# Patient Record
Sex: Female | Born: 1955 | Race: White | Hispanic: No | Marital: Single | Smoking: Never smoker
Health system: Southern US, Community
[De-identification: ages and names within clinical notes are randomized; demographics above are authoritative.]

## PROBLEM LIST (undated history)

## (undated) DIAGNOSIS — T4145XA Adverse effect of unspecified anesthetic, initial encounter: Secondary | ICD-10-CM

## (undated) DIAGNOSIS — R112 Nausea with vomiting, unspecified: Secondary | ICD-10-CM

## (undated) DIAGNOSIS — E039 Hypothyroidism, unspecified: Secondary | ICD-10-CM

## (undated) DIAGNOSIS — Z9889 Other specified postprocedural states: Secondary | ICD-10-CM

## (undated) DIAGNOSIS — T8859XA Other complications of anesthesia, initial encounter: Secondary | ICD-10-CM

## (undated) HISTORY — PX: CERVICAL CONE BIOPSY: SUR198

## (undated) HISTORY — PX: DILATION AND CURETTAGE OF UTERUS: SHX78

## (undated) HISTORY — PX: TONSILLECTOMY AND ADENOIDECTOMY: SUR1326

## (undated) HISTORY — PX: LASIK: SHX215

## (undated) HISTORY — PX: CARPAL TUNNEL RELEASE: SHX101

## (undated) HISTORY — PX: CHOLECYSTECTOMY: SHX55

---
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
## 2001-06-05 ENCOUNTER — Other Ambulatory Visit: Admission: RE | Admit: 2001-06-05 | Discharge: 2001-06-05 | Payer: Self-pay | Admitting: Family Medicine

## 2002-07-11 HISTORY — PX: EYE SURGERY: SHX253

## 2002-07-11 HISTORY — PX: LASIK: SHX215

## 2004-11-05 ENCOUNTER — Ambulatory Visit: Payer: Self-pay | Admitting: Vascular Surgery

## 2006-01-03 ENCOUNTER — Ambulatory Visit: Payer: Self-pay | Admitting: Family Medicine

## 2007-02-02 ENCOUNTER — Ambulatory Visit: Payer: Self-pay | Admitting: Family Medicine

## 2008-02-04 ENCOUNTER — Ambulatory Visit: Payer: Self-pay | Admitting: Family Medicine

## 2008-02-08 ENCOUNTER — Ambulatory Visit: Payer: Self-pay | Admitting: Unknown Physician Specialty

## 2008-02-08 LAB — HM COLONOSCOPY

## 2009-03-10 ENCOUNTER — Ambulatory Visit: Payer: Self-pay | Admitting: Family Medicine

## 2009-04-06 ENCOUNTER — Ambulatory Visit: Payer: Self-pay | Admitting: Unknown Physician Specialty

## 2009-04-09 ENCOUNTER — Ambulatory Visit: Payer: Self-pay | Admitting: Unknown Physician Specialty

## 2010-02-05 ENCOUNTER — Ambulatory Visit (HOSPITAL_BASED_OUTPATIENT_CLINIC_OR_DEPARTMENT_OTHER): Admission: RE | Admit: 2010-02-05 | Discharge: 2010-02-05 | Payer: Self-pay | Admitting: Orthopedic Surgery

## 2010-03-31 ENCOUNTER — Ambulatory Visit: Payer: Self-pay | Admitting: Unknown Physician Specialty

## 2011-04-14 ENCOUNTER — Ambulatory Visit: Payer: Self-pay | Admitting: Unknown Physician Specialty

## 2011-10-06 ENCOUNTER — Encounter: Payer: Self-pay | Admitting: Orthopaedic Surgery

## 2011-10-10 ENCOUNTER — Encounter: Payer: Self-pay | Admitting: Orthopaedic Surgery

## 2011-11-09 ENCOUNTER — Encounter: Payer: Self-pay | Admitting: Orthopaedic Surgery

## 2012-05-10 ENCOUNTER — Ambulatory Visit: Payer: Self-pay | Admitting: Family Medicine

## 2012-05-10 LAB — HM DEXA SCAN: HM DEXA SCAN: NORMAL

## 2012-05-30 ENCOUNTER — Ambulatory Visit: Payer: Self-pay | Admitting: Family Medicine

## 2013-05-17 LAB — TSH: TSH: 2.19 u[IU]/mL (ref <=5.90)

## 2013-05-17 LAB — HEPATIC FUNCTION PANEL
ALK PHOS: 79 U/L (ref 25–125)
ALT: 11 U/L (ref 7–35)
AST: 12 U/L — AB (ref 13–35)
Bilirubin, Total: 0.3 mg/dL

## 2013-05-17 LAB — CBC AND DIFFERENTIAL
HEMATOCRIT: 39 % (ref 36–46)
HEMOGLOBIN: 13.4 g/dL (ref 12.0–16.0)
Platelets: 350 10*3/uL (ref 150–399)
WBC: 7.6 10*3/mL

## 2013-05-17 LAB — BASIC METABOLIC PANEL
BUN: 11 mg/dL (ref 4–21)
CREATININE: 0.7 mg/dL (ref <=1.1)
Glucose: 87 mg/dL
Potassium: 4.5 mmol/L (ref 3.4–5.3)
SODIUM: 141 mmol/L (ref 137–147)

## 2013-05-17 LAB — LIPID PANEL
CHOLESTEROL: 213 mg/dL — AB (ref 0–200)
HDL: 48 mg/dL (ref 35–70)
LDL Cholesterol: 134 mg/dL
TRIGLYCERIDES: 156 mg/dL (ref 40–160)

## 2013-05-17 LAB — HM HEPATITIS C SCREENING LAB: HM Hepatitis Screen: NEGATIVE

## 2013-06-11 ENCOUNTER — Ambulatory Visit: Payer: Self-pay | Admitting: Family Medicine

## 2013-06-11 LAB — HM MAMMOGRAPHY: HM MAMMO: NORMAL

## 2014-05-17 LAB — CBC AND DIFFERENTIAL: Neutrophils Absolute: 71 /uL

## 2014-11-18 DIAGNOSIS — E669 Obesity, unspecified: Secondary | ICD-10-CM | POA: Insufficient documentation

## 2014-11-18 DIAGNOSIS — G4700 Insomnia, unspecified: Secondary | ICD-10-CM | POA: Insufficient documentation

## 2014-11-18 DIAGNOSIS — E7800 Pure hypercholesterolemia, unspecified: Secondary | ICD-10-CM | POA: Insufficient documentation

## 2014-11-18 DIAGNOSIS — M1990 Unspecified osteoarthritis, unspecified site: Secondary | ICD-10-CM | POA: Insufficient documentation

## 2014-11-18 DIAGNOSIS — F329 Major depressive disorder, single episode, unspecified: Secondary | ICD-10-CM | POA: Insufficient documentation

## 2014-11-18 DIAGNOSIS — F32A Depression, unspecified: Secondary | ICD-10-CM | POA: Insufficient documentation

## 2014-11-18 DIAGNOSIS — J309 Allergic rhinitis, unspecified: Secondary | ICD-10-CM | POA: Insufficient documentation

## 2014-11-18 DIAGNOSIS — E039 Hypothyroidism, unspecified: Secondary | ICD-10-CM | POA: Insufficient documentation

## 2014-11-18 DIAGNOSIS — A6000 Herpesviral infection of urogenital system, unspecified: Secondary | ICD-10-CM | POA: Insufficient documentation

## 2014-11-18 DIAGNOSIS — F988 Other specified behavioral and emotional disorders with onset usually occurring in childhood and adolescence: Secondary | ICD-10-CM | POA: Insufficient documentation

## 2014-11-18 DIAGNOSIS — E559 Vitamin D deficiency, unspecified: Secondary | ICD-10-CM | POA: Insufficient documentation

## 2014-11-18 DIAGNOSIS — G4733 Obstructive sleep apnea (adult) (pediatric): Secondary | ICD-10-CM | POA: Insufficient documentation

## 2014-11-18 DIAGNOSIS — F419 Anxiety disorder, unspecified: Secondary | ICD-10-CM | POA: Insufficient documentation

## 2015-01-07 ENCOUNTER — Other Ambulatory Visit: Payer: Self-pay | Admitting: Family Medicine

## 2015-01-08 NOTE — Telephone Encounter (Signed)
This is Dr. Gilbert's patient. Please review. Thank you-aa 

## 2015-01-19 ENCOUNTER — Ambulatory Visit: Payer: Self-pay | Admitting: Family Medicine

## 2015-01-23 ENCOUNTER — Other Ambulatory Visit: Payer: Self-pay | Admitting: Family Medicine

## 2015-02-23 ENCOUNTER — Ambulatory Visit (INDEPENDENT_AMBULATORY_CARE_PROVIDER_SITE_OTHER): Payer: BC Managed Care – PPO | Admitting: Family Medicine

## 2015-02-23 ENCOUNTER — Encounter: Payer: Self-pay | Admitting: Family Medicine

## 2015-02-23 VITALS — BP 108/62 | HR 84 | Temp 98.1°F | Resp 16 | Wt 206.0 lb

## 2015-02-23 DIAGNOSIS — F419 Anxiety disorder, unspecified: Secondary | ICD-10-CM | POA: Diagnosis not present

## 2015-02-23 DIAGNOSIS — F32A Depression, unspecified: Secondary | ICD-10-CM

## 2015-02-23 DIAGNOSIS — E039 Hypothyroidism, unspecified: Secondary | ICD-10-CM | POA: Diagnosis not present

## 2015-02-23 DIAGNOSIS — F329 Major depressive disorder, single episode, unspecified: Secondary | ICD-10-CM | POA: Diagnosis not present

## 2015-02-23 NOTE — Progress Notes (Signed)
Patient ID: Mikayla Munoz, female   DOB: Sep 01, 1955, 59 y.o.   MRN: [id number]    Subjective:  HPI  Depression, Follow-up  She  was last seen for this 6 months ago. Changes made at last visit include none.   She reports good compliance with treatment. She is not having side effects.   She reports good tolerance of treatment. Current symptoms include: none She feels she is Unchanged since last visit. Pt reports that she is doing well and does not want to change any of her medications at this time.  ------------------------------------------------------------------------  .   Prior to Admission medications   Medication Sig Start Date End Date Taking? Authorizing Provider  BIOTIN 5000 PO Take by mouth.   Yes Historical Provider, MD  Cholecalciferol (VITAMIN D) 2000 UNITS tablet Take by mouth.   Yes Historical Provider, MD  estradiol-norethindrone (ACTIVELLA) 1-0.5 MG per tablet Take by mouth. 08/01/11  Yes Historical Provider, MD  levothyroxine (SYNTHROID, LEVOTHROID) 50 MCG tablet Take by mouth. 11/11/14  Yes Historical Provider, MD  loratadine (CLARITIN) 10 MG tablet Take by mouth. 08/01/11  Yes Historical Provider, MD  Lutein-Zeaxanthin 25-5 MG CAPS Take by mouth.   Yes Historical Provider, MD  mometasone (NASONEX) 50 MCG/ACT nasal spray Place into the nose.   Yes Historical Provider, MD  nefazodone (SERZONE) 100 MG tablet TAKE THREE TABLETS BY MOUTH IN THE EVENING 01/08/15  Yes Margarita Rana, MD  valACYclovir (VALTREX) 500 MG tablet TAKE ONE TABLET BY MOUTH TWICE DAILY 01/23/15  Yes Jerrol Banana., MD  OMEGA-3 FATTY ACIDS PO Take by mouth.    Historical Provider, MD    Patient Active Problem List   Diagnosis Date Noted  . Adult attention deficit disorder 11/18/2014  . Allergic rhinitis 11/18/2014  . Anxiety 11/18/2014  . Clinical depression 11/18/2014  . Genital herpes 11/18/2014  . Hypercholesteremia 11/18/2014  . Adult hypothyroidism 11/18/2014  . Cannot sleep  11/18/2014  . Arthritis, degenerative 11/18/2014  . Adiposity 11/18/2014  . Obstructive apnea 11/18/2014  . Avitaminosis D 11/18/2014    History reviewed. No pertinent past medical history.  Social History   Social History  . Marital Status: Single    Spouse Name: N/A  . Number of Children: N/A  . Years of Education: N/A   Occupational History  . Not on file.   Social History Main Topics  . Smoking status: Never Smoker   . Smokeless tobacco: Not on file  . Alcohol Use: Yes     Comment: Ocassionally  . Drug Use: No  . Sexual Activity: Not on file   Other Topics Concern  . Not on file   Social History Narrative    Allergies  Allergen Reactions  . Codeine   . Expectorant Cough Control  [Guaifenesin]   . Hydrocodone-Acetaminophen     Review of Systems  Constitutional: Negative.   HENT: Negative.   Eyes: Negative.   Cardiovascular: Negative.   Gastrointestinal: Negative.   Genitourinary: Negative.   Musculoskeletal: Negative.   Skin: Negative.   Neurological: Negative.   Endo/Heme/Allergies: Negative.   Psychiatric/Behavioral: Negative.   All other systems reviewed and are negative.   Immunization History  Administered Date(s) Administered  . Hepatitis B 03/27/1997, 04/24/1997, 09/25/1997  . Tdap 11/16/2006   Objective:  BP 108/62 mmHg  Pulse 84  Temp(Src) 98.1 F (36.7 C) (Oral)  Resp 16  Wt 206 lb (93.441 kg)  Physical Exam  Constitutional: She is oriented to person, place, and time.  HENT:  Head: Normocephalic and atraumatic.  Right Ear: External ear normal.  Left Ear: External ear normal.  Nose: Nose normal.  Eyes: Conjunctivae are normal.  Neck: Neck supple.  Cardiovascular: Normal rate, regular rhythm and normal heart sounds.   Pulmonary/Chest: Effort normal and breath sounds normal.  Abdominal: Soft.  Neurological: She is alert and oriented to person, place, and time. Gait normal.  Skin: Skin is warm and dry.  Psychiatric: Mood,  memory, affect and judgment normal.  mild effusion of left knee bursa  Lab Results  Component Value Date   WBC 7.6 05/17/2013   HGB 13.4 05/17/2013   HCT 39 05/17/2013   PLT 350 05/17/2013   CHOL 213* 05/17/2013   TRIG 156 05/17/2013   HDL 48 05/17/2013   LDLCALC 134 05/17/2013   TSH 2.19 05/17/2013    CMP     Component Value Date/Time   NA 141 05/17/2013   K 4.5 05/17/2013   BUN 11 05/17/2013   CREATININE 0.7 05/17/2013   AST 12* 05/17/2013   ALT 11 05/17/2013   ALKPHOS 79 05/17/2013    Assessment and Plan :   1. Hypothyroidism, unspecified hypothyroidism type   2. Clinical depression Stable. Asian is retired now that is usual well. They are dealing with aging parents and the stress of that. Consider stopping medications in the next year or 2. Not yet.     3. Anxiety As above regarding depression 4.Bursitus of left knee Try NSAID for 2 weeks. May need Ortho referral if this bothers her.   Miguel Aschoff MD [facility] 02/23/2015 10:34 AM

## 2015-06-12 ENCOUNTER — Other Ambulatory Visit: Payer: Self-pay | Admitting: Family Medicine

## 2015-06-12 DIAGNOSIS — F32A Depression, unspecified: Secondary | ICD-10-CM

## 2015-06-12 DIAGNOSIS — F329 Major depressive disorder, single episode, unspecified: Secondary | ICD-10-CM

## 2015-08-23 ENCOUNTER — Other Ambulatory Visit: Payer: Self-pay | Admitting: Family Medicine

## 2015-08-26 ENCOUNTER — Encounter: Payer: BC Managed Care – PPO | Admitting: Family Medicine

## 2015-09-23 ENCOUNTER — Other Ambulatory Visit: Payer: Self-pay | Admitting: Family Medicine

## 2015-11-16 ENCOUNTER — Ambulatory Visit
Admission: RE | Admit: 2015-11-16 | Discharge: 2015-11-16 | Disposition: A | Discharge status: Home / Self Care | Payer: BC Managed Care – PPO | Source: Ambulatory Visit | Attending: Family Medicine | Admitting: Family Medicine

## 2015-11-16 ENCOUNTER — Ambulatory Visit (INDEPENDENT_AMBULATORY_CARE_PROVIDER_SITE_OTHER): Payer: BC Managed Care – PPO | Admitting: Family Medicine

## 2015-11-16 VITALS — BP 102/64 | HR 72 | Temp 97.6°F | Resp 16 | Wt 176.0 lb

## 2015-11-16 DIAGNOSIS — F329 Major depressive disorder, single episode, unspecified: Secondary | ICD-10-CM | POA: Diagnosis not present

## 2015-11-16 DIAGNOSIS — F32A Depression, unspecified: Secondary | ICD-10-CM

## 2015-11-16 DIAGNOSIS — M25552 Pain in left hip: Principal | ICD-10-CM

## 2015-11-16 DIAGNOSIS — M25551 Pain in right hip: Secondary | ICD-10-CM | POA: Insufficient documentation

## 2015-11-16 DIAGNOSIS — E039 Hypothyroidism, unspecified: Secondary | ICD-10-CM | POA: Diagnosis not present

## 2015-11-16 MED ORDER — IBUPROFEN 800 MG PO TABS: 800.0000 mg | ORAL_TABLET | Freq: Three times a day (TID) | ORAL | Status: DC

## 2015-11-16 NOTE — Progress Notes (Signed)
Patient ID: Mikayla Munoz, female   DOB: [birth year], 60 y.o.   MRN: [id number]     Subjective:  HPI  Patient is here for follow up.  Hypothyroidism:  Lab Results  Component Value Date   TSH 2.19 05/17/2013   Hip pain: Patient states that she started to exercise again and in October her hips started to ache/hurt and then she rested and did not exercise so hard so the pain got better. Then 2 days ago she was running and somehow aggravated her right hip. It was hurting bad after running. She did use ice and Ibuprofen and pain is better today. Hips always hurt usually with weight bearing and sometimes with laying down, she is able to sit without the pain. Pain does not radiate, localized.  Prior to Admission medications   Medication Sig Start Date End Date Taking? Authorizing Provider  BIOTIN 5000 PO Take by mouth.   Yes Historical Provider, MD  Cholecalciferol (VITAMIN D) 2000 UNITS tablet Take by mouth.   Yes Historical Provider, MD  estradiol-norethindrone (ACTIVELLA) 1-0.5 MG per tablet Take by mouth. 08/01/11  Yes Historical Provider, MD  levothyroxine (SYNTHROID, LEVOTHROID) 50 MCG tablet Take by mouth. 11/11/14  Yes Historical Provider, MD  loratadine (CLARITIN) 10 MG tablet Take by mouth. 08/01/11  Yes Historical Provider, MD  Lutein-Zeaxanthin 25-5 MG CAPS Take by mouth.   Yes Historical Provider, MD  mometasone (NASONEX) 50 MCG/ACT nasal spray Place into the nose.   Yes Historical Provider, MD  nefazodone (SERZONE) 100 MG tablet TAKE THREE TABLETS BY MOUTH IN THE EVENING 09/24/15  Yes Kaelie Henigan Maceo Pro., MD  valACYclovir (VALTREX) 500 MG tablet TAKE ONE TABLET BY MOUTH TWICE DAILY 01/23/15  Yes Jerrol Banana., MD    Patient Active Problem List   Diagnosis Date Noted  . Adult attention deficit disorder 11/18/2014  . Allergic rhinitis 11/18/2014  . Anxiety 11/18/2014  . Clinical depression 11/18/2014  . Genital herpes 11/18/2014  . Hypercholesteremia 11/18/2014  . Adult  hypothyroidism 11/18/2014  . Cannot sleep 11/18/2014  . Arthritis, degenerative 11/18/2014  . Adiposity 11/18/2014  . Obstructive apnea 11/18/2014  . Avitaminosis D 11/18/2014    No past medical history on file.  Social History   Social History  . Marital Status: Single    Spouse Name: N/A  . Number of Children: N/A  . Years of Education: N/A   Occupational History  . Not on file.   Social History Main Topics  . Smoking status: Never Smoker   . Smokeless tobacco: Not on file  . Alcohol Use: Yes     Comment: Ocassionally  . Drug Use: No  . Sexual Activity: Not on file   Other Topics Concern  . Not on file   Social History Narrative    Allergies  Allergen Reactions  . Codeine   . Expectorant Cough Control  [Guaifenesin]   . Hydrocodone-Acetaminophen     Review of Systems  Constitutional: Negative.   Eyes: Negative.   Respiratory: Negative.   Cardiovascular: Negative.   Gastrointestinal: Negative.   Genitourinary: Negative.   Musculoskeletal: Positive for myalgias and joint pain.  Skin: Negative.   Endo/Heme/Allergies: Negative.   Psychiatric/Behavioral: Negative.     Immunization History  Administered Date(s) Administered  . Hepatitis B 03/27/1997, 04/24/1997, 09/25/1997  . Tdap 11/16/2006   Objective:  BP 102/64 mmHg  Pulse 72  Temp(Src) 97.6 F (36.4 C)  Resp 16  Wt 176 lb (79.833 kg)  Physical Exam  Constitutional: She is oriented to person, place, and time and well-developed, well-nourished, and in no distress.  HENT:  Head: Normocephalic and atraumatic.  Right Ear: External ear normal.  Left Ear: External ear normal.  Nose: Nose normal.  Eyes: Conjunctivae are normal.  Neck: Neck supple. No thyromegaly present.  Cardiovascular: Normal rate, regular rhythm and normal heart sounds.   Pulmonary/Chest: Effort normal and breath sounds normal.  Abdominal: Soft.  Musculoskeletal: She exhibits no edema.  Neurological: She is alert and  oriented to person, place, and time. No cranial nerve deficit. She exhibits normal muscle tone. Gait normal. Coordination normal. GCS score is 15.  Positive figure 4 maneuver on the right with internal rotation.  Skin: Skin is warm and dry.  Psychiatric: Mood, memory, affect and judgment normal.   neurologic exam grossly nonfocal.   Lab Results  Component Value Date   WBC 7.6 05/17/2013   HGB 13.4 05/17/2013   HCT 39 05/17/2013   PLT 350 05/17/2013   CHOL 213* 05/17/2013   TRIG 156 05/17/2013   HDL 48 05/17/2013   LDLCALC 134 05/17/2013   TSH 2.19 05/17/2013    CMP     Component Value Date/Time   NA 141 05/17/2013   K 4.5 05/17/2013   BUN 11 05/17/2013   CREATININE 0.7 05/17/2013   AST 12* 05/17/2013   ALT 11 05/17/2013   ALKPHOS 79 05/17/2013    Assessment and Plan :  1. Clinical depression Stable. Probable lifelong Serzone  2. Hypothyroidism, unspecified hypothyroidism type Patient gets labs done with gynecologist. Advised patient to get copy of lab results for [place].  3. Bilateral hip pain Will get Xrays of bilateral hips, will go ahead and treat, if symptoms do not get better with refer to physical therapy. If symptoms get worse will refer to sports/ortho specialist. Joint pain vs LBP etiology. Patient was seen and examined by Dr. Eulas Post and note was scribed by Theressa Millard, RMA.     Miguel Aschoff MD [facility] 11/16/2015 2:24 PM

## 2015-11-17 ENCOUNTER — Other Ambulatory Visit: Payer: Self-pay | Admitting: Family Medicine

## 2015-12-10 ENCOUNTER — Encounter: Payer: Self-pay | Admitting: Family Medicine

## 2016-02-26 ENCOUNTER — Other Ambulatory Visit: Payer: Self-pay | Admitting: Family Medicine

## 2016-02-26 NOTE — Telephone Encounter (Signed)
LOV 11/16/2015. Renaldo Fiddler, CMA

## 2016-07-05 ENCOUNTER — Ambulatory Visit (INDEPENDENT_AMBULATORY_CARE_PROVIDER_SITE_OTHER): Payer: BC Managed Care – PPO | Admitting: Family Medicine

## 2016-07-05 ENCOUNTER — Encounter: Payer: Self-pay | Admitting: Family Medicine

## 2016-07-05 VITALS — BP 118/64 | HR 78 | Temp 97.9°F | Resp 16 | Wt 188.0 lb

## 2016-07-05 DIAGNOSIS — J329 Chronic sinusitis, unspecified: Secondary | ICD-10-CM

## 2016-07-05 MED ORDER — AMOXICILLIN 500 MG PO CAPS: 1000.0000 mg | ORAL_CAPSULE | Freq: Three times a day (TID) | ORAL | 0 refills | Status: AC

## 2016-07-05 MED ORDER — SYNTHROID 50 MCG PO TABS: 50.0000 ug | ORAL_TABLET | Freq: Every day | ORAL | 12 refills | Status: DC

## 2016-07-05 NOTE — Progress Notes (Signed)
       Patient: Mikayla Munoz Female    DOB: 1956/05/14   60 y.o.   MRN: [id number] Visit Date: 07/05/2016  Today's Provider: Lelon Huh, MD   Chief Complaint  Patient presents with  . Cough   Subjective:    Cough  This is a recurrent problem. Episode onset: 2 weeks. The problem has been gradually worsening. Associated symptoms include ear congestion, ear pain, headaches, postnasal drip and a sore throat. Pertinent negatives include no chills or fever. She has tried OTC cough suppressant for the symptoms.       Allergies  Allergen Reactions  . Codeine   . Expectorant Cough Control  [Guaifenesin]   . Hydrocodone-Acetaminophen      Current Outpatient Prescriptions:  .  BIOTIN 5000 PO, Take by mouth., Disp: , Rfl:  .  Cholecalciferol (VITAMIN D) 2000 UNITS tablet, Take by mouth., Disp: , Rfl:  .  estradiol-norethindrone (ACTIVELLA) 1-0.5 MG per tablet, Take by mouth., Disp: , Rfl:  .  ibuprofen (ADVIL,MOTRIN) 800 MG tablet, Take 1 tablet (800 mg total) by mouth 3 (three) times daily., Disp: 90 tablet, Rfl: 2 .  loratadine (CLARITIN) 10 MG tablet, Take by mouth., Disp: , Rfl:  .  Lutein-Zeaxanthin 25-5 MG CAPS, Take by mouth., Disp: , Rfl:  .  mometasone (NASONEX) 50 MCG/ACT nasal spray, Place into the nose., Disp: , Rfl:  .  nefazodone (SERZONE) 100 MG tablet, TAKE THREE TABLETS BY MOUTH IN THE EVENING, Disp: 90 tablet, Rfl: 11 .  SYNTHROID 50 MCG tablet, TAKE ONE TABLET BY MOUTH ONCE DAILY, Disp: 30 tablet, Rfl: 12 .  valACYclovir (VALTREX) 500 MG tablet, TAKE ONE TABLET BY MOUTH TWICE DAILY, Disp: 60 tablet, Rfl: 12  Review of Systems  Constitutional: Negative for chills and fever.  HENT: Positive for ear pain, postnasal drip and sore throat.   Respiratory: Positive for cough.   Neurological: Positive for headaches.    Social History  Substance Use Topics  . Smoking status: Never Smoker  . Smokeless tobacco: Not on file  . Alcohol use Yes     Comment: Ocassionally    Objective:   BP 118/64 (BP Location: Left Arm, Patient Position: Sitting, Cuff Size: Normal)   Pulse 78   Temp 97.9 F (36.6 C)   Resp 16   Wt 188 lb (85.3 kg)   SpO2 98%   BMI 32.27 kg/m   Physical Exam  General Appearance:    Alert, cooperative, no distress  HENT:   bilateral TM normal without fluid or infection, neck has bilateral anterior cervical nodes enlarged, pharynx erythematous without exudate, frontal sinus tender and nasal mucosa pale and congested  Eyes:    PERRL, conjunctiva/corneas clear, EOM's intact       Lungs:     Clear to auscultation bilaterally, respirations unlabored  Heart:    Regular rate and rhythm  Neurologic:   Awake, alert, oriented x 3. No apparent focal neurological           defect.           Assessment & Plan:     1. Sinusitis, unspecified chronicity, unspecified location  - amoxicillin (AMOXIL) 500 MG capsule; Take 2 capsules (1,000 mg total) by mouth 3 (three) times daily.  Dispense: 30 capsule; Refill: 0       Lelon Huh, MD  [facility]

## 2016-09-30 ENCOUNTER — Other Ambulatory Visit: Payer: Self-pay | Admitting: Obstetrics & Gynecology

## 2016-09-30 DIAGNOSIS — Z1231 Encounter for screening mammogram for malignant neoplasm of breast: Secondary | ICD-10-CM

## 2016-09-30 LAB — HM PAP SMEAR: HM Pap smear: NORMAL

## 2016-10-04 LAB — CBC AND DIFFERENTIAL
HCT: 42 (ref 36–46)
Hemoglobin: 14.7 (ref 12.0–16.0)
Neutrophils Absolute: 4
PLATELETS: 339 (ref 150–399)
WBC: 5.7

## 2016-10-04 LAB — LIPID PANEL
CHOLESTEROL: 263 mg/dL — AB (ref 0–200)
HDL: 59 mg/dL (ref 35–70)
LDL Cholesterol: 179 mg/dL
LDL/HDL RATIO: 4.5
Triglycerides: 125 mg/dL (ref 40–160)

## 2016-10-04 LAB — VITAMIN D 25 HYDROXY (VIT D DEFICIENCY, FRACTURES): Vit D, 25-Hydroxy: 51.2

## 2016-10-04 LAB — TSH: TSH: 3.29 (ref <=5.90)

## 2016-10-04 LAB — HEPATIC FUNCTION PANEL
ALT: 11 (ref 7–35)
AST: 14 (ref 13–35)
Alkaline Phosphatase: 54 (ref 25–125)
BILIRUBIN, TOTAL: 0.7

## 2016-10-04 LAB — BASIC METABOLIC PANEL
BUN: 17 (ref 4–21)
Creatinine: 0.9 (ref <=1.1)
GLUCOSE: 103
Potassium: 4.5 (ref 3.4–5.3)
SODIUM: 142 (ref 137–147)

## 2016-10-26 ENCOUNTER — Ambulatory Visit
Admission: RE | Admit: 2016-10-26 | Discharge: 2016-10-26 | Disposition: A | Discharge status: Home / Self Care | Payer: BC Managed Care – PPO | Source: Ambulatory Visit | Attending: Obstetrics & Gynecology | Admitting: Obstetrics & Gynecology

## 2016-10-26 DIAGNOSIS — Z1231 Encounter for screening mammogram for malignant neoplasm of breast: Secondary | ICD-10-CM | POA: Diagnosis present

## 2016-11-24 ENCOUNTER — Telehealth: Payer: Self-pay | Admitting: Emergency Medicine

## 2016-11-24 NOTE — Telephone Encounter (Signed)
Called pt about her last lipid panel per Dr. Rosanna Randy he wanted to see her sometimes this summer. She states that she is not in front of her calendar at this time and is leaving to go out of town for 2 weeks this week. So she did not make an appt today but says she needs a "general med follow up" any ways and will call back when she gets back in town.

## 2017-01-25 ENCOUNTER — Ambulatory Visit (INDEPENDENT_AMBULATORY_CARE_PROVIDER_SITE_OTHER): Payer: BC Managed Care – PPO | Admitting: Family Medicine

## 2017-01-25 VITALS — BP 118/82 | HR 72 | Temp 97.7°F | Resp 12 | Wt 189.0 lb

## 2017-01-25 DIAGNOSIS — E7800 Pure hypercholesterolemia, unspecified: Secondary | ICD-10-CM | POA: Diagnosis not present

## 2017-01-25 DIAGNOSIS — Z780 Asymptomatic menopausal state: Secondary | ICD-10-CM

## 2017-01-25 DIAGNOSIS — L821 Other seborrheic keratosis: Secondary | ICD-10-CM

## 2017-01-25 DIAGNOSIS — Z6832 Body mass index (BMI) 32.0-32.9, adult: Secondary | ICD-10-CM | POA: Diagnosis not present

## 2017-01-25 DIAGNOSIS — F3289 Other specified depressive episodes: Secondary | ICD-10-CM

## 2017-01-25 DIAGNOSIS — E039 Hypothyroidism, unspecified: Secondary | ICD-10-CM | POA: Diagnosis not present

## 2017-01-25 DIAGNOSIS — J301 Allergic rhinitis due to pollen: Secondary | ICD-10-CM

## 2017-01-25 DIAGNOSIS — Z23 Encounter for immunization: Secondary | ICD-10-CM

## 2017-01-25 MED ORDER — ROSUVASTATIN CALCIUM 5 MG PO TABS: 5.0000 mg | ORAL_TABLET | Freq: Every day | ORAL | 12 refills | Status: DC

## 2017-01-25 MED ORDER — NEFAZODONE HCL 50 MG PO TABS: ORAL_TABLET | ORAL | 0 refills | Status: DC

## 2017-01-25 NOTE — Patient Instructions (Signed)
Take Crestor at bedtime. Re check in 2 months.

## 2017-01-25 NOTE — Progress Notes (Signed)
Mikayla Munoz  MRN: [id number] DOB: Nov 18, 1955  Subjective:  HPI She wishes to come off of serzone now that she has retired. Has cut dose by 2/3. Patient is here for routine follow up. Last follow up visit was on 11/16/15. We wanted to see patient in regards to patient's cholesterol levels. Hypothyroidism: patient had all lab work done through her gynecologist in March 2018. Wt Readings from Last 3 Encounters:  01/25/17 189 lb (85.7 kg)  07/05/16 188 lb (85.3 kg)  11/16/15 176 lb (79.8 kg)   BP Readings from Last 3 Encounters:  01/25/17 118/82  07/05/16 118/64  11/16/15 102/64    Patient Active Problem List   Diagnosis Date Noted  . Adult attention deficit disorder 11/18/2014  . Allergic rhinitis 11/18/2014  . Anxiety 11/18/2014  . Clinical depression 11/18/2014  . Genital herpes 11/18/2014  . Hypercholesteremia 11/18/2014  . Adult hypothyroidism 11/18/2014  . Cannot sleep 11/18/2014  . Arthritis, degenerative 11/18/2014  . Adiposity 11/18/2014  . Obstructive apnea 11/18/2014  . Avitaminosis D 11/18/2014    No past medical history on file.  Social History   Social History  . Marital status: Single    Spouse name: N/A  . Number of children: N/A  . Years of education: N/A   Occupational History  . Not on file.   Social History Main Topics  . Smoking status: Never Smoker  . Smokeless tobacco: Not on file  . Alcohol use Yes     Comment: Ocassionally  . Drug use: No  . Sexual activity: Not on file   Other Topics Concern  . Not on file   Social History Narrative  . No narrative on file    Outpatient Encounter Prescriptions as of 01/25/2017  Medication Sig Note  . BIOTIN 5000 PO Take by mouth.   . Cholecalciferol (VITAMIN D) 2000 UNITS tablet Take by mouth. 11/18/2014: Received from: [facility]  . ibuprofen (ADVIL,MOTRIN) 800 MG tablet Take 1 tablet (800 mg total) by mouth 3 (three) times daily.   Marland Kitchen loratadine (CLARITIN) 10 MG tablet Take  by mouth. 11/18/2014: Received from: [facility]  . Lutein-Zeaxanthin 25-5 MG CAPS Take by mouth. 11/18/2014: Received from: [facility]  . mometasone (NASONEX) 50 MCG/ACT nasal spray Place into the nose. 11/18/2014: Received from: [facility]  . nefazodone (SERZONE) 100 MG tablet TAKE THREE TABLETS BY MOUTH IN THE EVENING   . SYNTHROID 50 MCG tablet Take 1 tablet (50 mcg total) by mouth daily.   . valACYclovir (VALTREX) 500 MG tablet TAKE ONE TABLET BY MOUTH TWICE DAILY   . [DISCONTINUED] estradiol-norethindrone (ACTIVELLA) 1-0.5 MG per tablet Take by mouth. 11/18/2014: Received from: [facility]   No facility-administered encounter medications on file as of 01/25/2017.     Allergies  Allergen Reactions  . Codeine   . Expectorant Cough Control  [Guaifenesin]   . Hydrocodone-Acetaminophen     Review of Systems  Constitutional: Negative.   HENT: Positive for congestion.   Respiratory: Negative.   Cardiovascular: Negative.   Musculoskeletal: Positive for joint pain (hips off and on).  Neurological: Negative.     Objective:  BP 118/82   Pulse 72   Temp 97.7 F (36.5 C)   Resp 12   Wt 189 lb (85.7 kg)   BMI 32.44 kg/m   Physical Exam  Constitutional: She is oriented to person, place, and time and well-developed, well-nourished, and in no distress.  HENT:  Head: Normocephalic and atraumatic.  Right Ear: External ear normal.  Left Ear: External ear normal.  Nose: Nose normal.  Eyes: Pupils are equal, round, and reactive to light. Conjunctivae are normal.  Neck: Normal range of motion. Neck supple.  Cardiovascular: Normal rate, regular rhythm, normal heart sounds and intact distal pulses.  Exam reveals no gallop.   No murmur heard. Pulmonary/Chest: Effort normal and breath sounds normal. No respiratory distress. She has no wheezes.  Abdominal: Soft.  Musculoskeletal: She exhibits no edema or tenderness.    Neurological: She is alert and oriented to person, place, and time.  Skin: Skin is warm and dry.  Psychiatric: Mood, memory, affect and judgment normal.   Assessment and Plan :  1. Seasonal allergic rhinitis due to pollen Stable. Advised patient to check with insurance on Shingles immunization coverage. Advised patient can get this done on next visit if patient wants to get this done. 2. Adult hypothyroidism Normal in March 2018.  3. Other depression Stable. Patient has weaned down on Serzone to 50 mg daily. Patient wants to wean off the medication. Will follow patient off the medication.  4. Hypercholesteremia Discuss in details with patient on options for treatment for Lipids. With patient's family history I think it would be best to get patient started on low dose of Crestor-discussed possible side effects of joint pain. Patient agrees to start treatment. Re check in 2 months on lipids and lab work at that time.  5. BMI 32.0-32.9,adult Continue working on habits.  6. Need for tetanus booster Administer Td. 7. Post menopausal Discussed with patient risks vs benefits of taking estrogen treatment. Patient can continue taking this and follow. 8. SK  HPI, Exam and A&P transcribed by Theressa Millard, RMA under direction and in the presence of Miguel Aschoff, MD.

## 2017-03-06 ENCOUNTER — Other Ambulatory Visit: Payer: Self-pay | Admitting: Family Medicine

## 2017-03-30 ENCOUNTER — Ambulatory Visit (INDEPENDENT_AMBULATORY_CARE_PROVIDER_SITE_OTHER): Payer: BC Managed Care – PPO | Admitting: Family Medicine

## 2017-03-30 VITALS — BP 98/62 | HR 84 | Temp 97.7°F | Resp 14 | Wt 193.2 lb

## 2017-03-30 DIAGNOSIS — Z2821 Immunization not carried out because of patient refusal: Secondary | ICD-10-CM | POA: Diagnosis not present

## 2017-03-30 DIAGNOSIS — F3289 Other specified depressive episodes: Secondary | ICD-10-CM | POA: Diagnosis not present

## 2017-03-30 DIAGNOSIS — K130 Diseases of lips: Secondary | ICD-10-CM | POA: Diagnosis not present

## 2017-03-30 DIAGNOSIS — E7800 Pure hypercholesterolemia, unspecified: Secondary | ICD-10-CM

## 2017-03-30 NOTE — Progress Notes (Signed)
Mikayla Munoz  MRN: [id number] DOB: 1955/07/13  Subjective:  HPI   Patient is here for 2 months follow up on cholesterol after starting Crestor 5 mg. Patient is taking this medication. She has not noticed any extra joint or muscle pain then her usual. Lab Results  Component Value Date   CHOL 263 (A) 10/04/2016   HDL 59 10/04/2016   LDLCALC 179 10/04/2016   TRIG 125 10/04/2016    She is off Serzone and is doing fine she states emotionally. Depression screen [facility] 2/9 01/25/2017  Decreased Interest 0  Down, Depressed, Hopeless 0  PHQ - 2 Score 0  Altered sleeping 1  Tired, decreased energy 0  Change in appetite 1  Feeling bad or failure about yourself  0  Trouble concentrating 0  Moving slowly or fidgety/restless 0  Suicidal thoughts 0  PHQ-9 Score 2   Patient is having cracked itchy lips and around her lips.  Patient Active Problem List   Diagnosis Date Noted  . Adult attention deficit disorder 11/18/2014  . Allergic rhinitis 11/18/2014  . Anxiety 11/18/2014  . Clinical depression 11/18/2014  . Genital herpes 11/18/2014  . Hypercholesteremia 11/18/2014  . Adult hypothyroidism 11/18/2014  . Cannot sleep 11/18/2014  . Arthritis, degenerative 11/18/2014  . Adiposity 11/18/2014  . Obstructive apnea 11/18/2014  . Avitaminosis D 11/18/2014    No past medical history on file.  Social History   Social History  . Marital status: Single    Spouse name: N/A  . Number of children: N/A  . Years of education: N/A   Occupational History  . Not on file.   Social History Main Topics  . Smoking status: Never Smoker  . Smokeless tobacco: Not on file  . Alcohol use Yes     Comment: Ocassionally  . Drug use: No  . Sexual activity: Not on file   Other Topics Concern  . Not on file   Social History Narrative  . No narrative on file    Outpatient Encounter Prescriptions as of 03/30/2017  Medication Sig Note  . BIOTIN 5000 PO Take by mouth.   . Cholecalciferol  (VITAMIN D) 2000 UNITS tablet Take by mouth. 11/18/2014: Received from: [facility]  . ibuprofen (ADVIL,MOTRIN) 800 MG tablet Take 1 tablet (800 mg total) by mouth 3 (three) times daily.   Marland Kitchen loratadine (CLARITIN) 10 MG tablet Take by mouth. 11/18/2014: Received from: [facility]  . Lutein-Zeaxanthin 25-5 MG CAPS Take by mouth. 11/18/2014: Received from: [facility]  . MIMVEY 1-0.5 MG tablet    . mometasone (NASONEX) 50 MCG/ACT nasal spray Place into the nose. 11/18/2014: Received from: [facility]  . rosuvastatin (CRESTOR) 5 MG tablet Take 1 tablet (5 mg total) by mouth at bedtime.   Marland Kitchen SYNTHROID 50 MCG tablet Take 1 tablet (50 mcg total) by mouth daily.   . valACYclovir (VALTREX) 500 MG tablet TAKE ONE TABLET BY MOUTH TWICE DAILY   . [DISCONTINUED] nefazodone (SERZONE) 50 MG tablet 1 tablet daily-updated in the chart today    No facility-administered encounter medications on file as of 03/30/2017.     Allergies  Allergen Reactions  . Codeine   . Expectorant Cough Control  [Guaifenesin]   . Hydrocodone-Acetaminophen     Review of Systems  Constitutional: Negative.   Respiratory: Negative.   Cardiovascular: Negative.   Musculoskeletal: Positive for joint pain and myalgias.  Skin: Positive for itching.  Neurological: Negative.   Psychiatric/Behavioral: Negative.  Objective:  BP 98/62   Pulse 84   Temp 97.7 F (36.5 C)   Resp 14   Wt 193 lb 3.2 oz (87.6 kg)   BMI 33.16 kg/m   Physical Exam  Constitutional: She is well-developed, well-nourished, and in no distress.  HENT:  Cheilitis in the corners of her mouth, red area around the bottom lips.  Cardiovascular: Normal rate, regular rhythm, normal heart sounds and intact distal pulses.  Exam reveals no gallop.   No murmur heard. Pulmonary/Chest: Effort normal and breath sounds normal. No respiratory distress. She has no wheezes.    Assessment and Plan :    1. Other depression Stable. Doing well off the medication.  2. Hypercholesteremia Will check lab work today. 3. Cheilitis Try Lysine over the counter daily. Keep appointment with dermatologist in October. This could be coming from taking Crestor that would be an unusual issue.   HPI, Exam and A&P transcribed by Tiffany Kocher, RMA under direction and in the presence of Miguel Aschoff, MD. I have done the exam and reviewed the chart and it is accurate to the best of my knowledge. Development worker, community has been used and  any errors in dictation or transcription are unintentional. Miguel Aschoff M.D. [facility]

## 2017-03-30 NOTE — Patient Instructions (Addendum)
Try over the counter Lysine 1 daily. Keep appointment with dermatologist in October.

## 2017-03-31 LAB — LIPID PANEL
CHOL/HDL RATIO: 2.1 (calc) (ref <5.0)
CHOLESTEROL: 148 mg/dL (ref <200)
HDL: 69 mg/dL (ref >50)
LDL CHOLESTEROL (CALC): 63 mg/dL
NON-HDL CHOLESTEROL (CALC): 79 mg/dL (ref <130)
Triglycerides: 75 mg/dL (ref <150)

## 2017-05-25 ENCOUNTER — Telehealth: Payer: Self-pay | Admitting: Family Medicine

## 2017-05-25 NOTE — Telephone Encounter (Signed)
Pt states she is going on a cruise the first of December and is requesting a Rx for the nausea patch for at least 2 weeks.  [place]  RQ#[phone]/[id number]

## 2017-05-25 NOTE — Telephone Encounter (Signed)
Please review-Mikayla Munoz [name], RMA  

## 2017-05-30 MED ORDER — SCOPOLAMINE 1 MG/3DAYS TD PT72: 1.0000 | MEDICATED_PATCH | TRANSDERMAL | 1 refills | Status: DC

## 2017-05-30 NOTE — Telephone Encounter (Signed)
Ok--scopolamine patch q 3 days--box of 4 with rf.

## 2017-05-30 NOTE — Telephone Encounter (Signed)
rx sent in, pt advised-Anastasiya V Hopkins, RMA

## 2017-08-09 ENCOUNTER — Other Ambulatory Visit: Payer: Self-pay | Admitting: Family Medicine

## 2017-09-25 ENCOUNTER — Ambulatory Visit: Payer: BC Managed Care – PPO | Admitting: Family Medicine

## 2017-10-09 ENCOUNTER — Ambulatory Visit: Payer: BC Managed Care – PPO | Admitting: Family Medicine

## 2017-10-09 ENCOUNTER — Other Ambulatory Visit: Payer: Self-pay

## 2017-10-09 VITALS — BP 110/70 | HR 74 | Temp 98.1°F | Resp 16 | Wt 201.0 lb

## 2017-10-09 DIAGNOSIS — R0600 Dyspnea, unspecified: Secondary | ICD-10-CM

## 2017-10-09 DIAGNOSIS — E039 Hypothyroidism, unspecified: Secondary | ICD-10-CM

## 2017-10-09 DIAGNOSIS — F3289 Other specified depressive episodes: Secondary | ICD-10-CM | POA: Diagnosis not present

## 2017-10-09 DIAGNOSIS — E7800 Pure hypercholesterolemia, unspecified: Secondary | ICD-10-CM | POA: Diagnosis not present

## 2017-10-09 NOTE — Progress Notes (Signed)
Mikayla Munoz  MRN: [id number] DOB: [birth year]  Subjective:  HPI   The patient is a 62 year old female who presents for 6 month follow up of chronic illness.  She was last seen on 03/30/17 for depression and hypercholesterolemia.  Her lipid panel was done at that time but it is time for her other labs to be checked.    Depression-The patient has been off of her Serzone for several months and states she is doing well.   Hypothyroidism-patient is due for her labs.  The patient has been exercising regularly for the last 2 years and states that she is wondering if she has some asthma.  She states that at times, and not just when she is exercising she has some trouble taking in a deep breath.  Patient Active Problem List   Diagnosis Date Noted  . Adult attention deficit disorder 11/18/2014  . Allergic rhinitis 11/18/2014  . Anxiety 11/18/2014  . Clinical depression 11/18/2014  . Genital herpes 11/18/2014  . Hypercholesteremia 11/18/2014  . Adult hypothyroidism 11/18/2014  . Cannot sleep 11/18/2014  . Arthritis, degenerative 11/18/2014  . Adiposity 11/18/2014  . Obstructive apnea 11/18/2014  . Avitaminosis D 11/18/2014    No past medical history on file.  Social History   Socioeconomic History  . Marital status: Single    Spouse name: Not on file  . Number of children: Not on file  . Years of education: Not on file  . Highest education level: Not on file  Occupational History  . Not on file  Social Needs  . Financial resource strain: Not on file  . Food insecurity:    Worry: Not on file    Inability: Not on file  . Transportation needs:    Medical: Not on file    Non-medical: Not on file  Tobacco Use  . Smoking status: Never Smoker  Substance and Sexual Activity  . Alcohol use: Yes    Comment: Ocassionally  . Drug use: No  . Sexual activity: Not on file  Lifestyle  . Physical activity:    Days per week: Not on file    Minutes per session: Not on file  . Stress:  Not on file  Relationships  . Social connections:    Talks on phone: Not on file    Gets together: Not on file    Attends religious service: Not on file    Active member of club or organization: Not on file    Attends meetings of clubs or organizations: Not on file    Relationship status: Not on file  . Intimate partner violence:    Fear of current or ex partner: Not on file    Emotionally abused: Not on file    Physically abused: Not on file    Forced sexual activity: Not on file  Other Topics Concern  . Not on file  Social History Narrative  . Not on file    Outpatient Encounter Medications as of 10/09/2017  Medication Sig Note  . BIOTIN 5000 PO Take by mouth.   . Cholecalciferol (VITAMIN D) 2000 UNITS tablet Take by mouth. 11/18/2014: Received from: [facility]  . ibuprofen (ADVIL,MOTRIN) 800 MG tablet Take 1 tablet (800 mg total) by mouth 3 (three) times daily.   Marland Kitchen loratadine (CLARITIN) 10 MG tablet Take by mouth. 11/18/2014: Received from: [facility]  . Lutein-Zeaxanthin 25-5 MG CAPS Take by mouth. 11/18/2014: Received from: [facility]  . MIMVEY 1-0.5 MG  tablet    . mometasone (NASONEX) 50 MCG/ACT nasal spray Place into the nose. 11/18/2014: Received from: [facility]  . rosuvastatin (CRESTOR) 5 MG tablet Take 1 tablet (5 mg total) by mouth at bedtime.   Marland Kitchen scopolamine (TRANSDERM-SCOP) 1 MG/3DAYS Place 1 patch (1.5 mg total) onto the skin every 3 (three) days.   Marland Kitchen SYNTHROID 50 MCG tablet TAKE ONE TABLET BY MOUTH ONCE DAILY   . valACYclovir (VALTREX) 500 MG tablet TAKE ONE TABLET BY MOUTH TWICE DAILY    No facility-administered encounter medications on file as of 10/09/2017.     Allergies  Allergen Reactions  . Codeine   . Expectorant Cough Control  [Guaifenesin]   . Hydrocodone-Acetaminophen     Review of Systems  Constitutional: Negative for fever and malaise/fatigue.  Eyes: Negative.   Respiratory:  Negative for cough, shortness of breath and wheezing.   Cardiovascular: Negative for chest pain, palpitations, orthopnea, claudication and leg swelling.  Gastrointestinal: Negative.   Skin: Negative.   Neurological: Negative for dizziness and headaches.  Endo/Heme/Allergies: Negative.   Psychiatric/Behavioral: Negative for depression, hallucinations, memory loss, substance abuse and suicidal ideas. The patient has insomnia (some nights wakes often and can't go back to sleep.). The patient is not nervous/anxious.     Objective:  There were no vitals taken for this visit.  Physical Exam  Constitutional: She is oriented to person, place, and time and well-developed, well-nourished, and in no distress.  HENT:  Head: Normocephalic and atraumatic.  Eyes: Conjunctivae are normal. No scleral icterus.  Neck: No thyromegaly present.  Cardiovascular: Normal rate, regular rhythm and normal heart sounds.  Pulmonary/Chest: Effort normal and breath sounds normal.  Abdominal: Soft. Bowel sounds are normal.  Musculoskeletal: She exhibits no edema.  Neurological: She is alert and oriented to person, place, and time. Gait normal. GCS score is 15.  Skin: Skin is warm and dry.  Psychiatric: Mood, memory, affect and judgment normal.    Assessment and Plan :  1. Other depression In remission off of meds.  2. Dyspnea, unspecified type  - Spirometry with Graph  3. Adult hypothyroidism  - CBC with Differential/Platelet - TSH  4. Hypercholesteremia  - CBC with Differential/Platelet - Comprehensive metabolic panel  I have done the exam and reviewed the chart and it is accurate to the best of my knowledge. Development worker, community has been used and  any errors in dictation or transcription are unintentional. Miguel Aschoff M.D. [facility]

## 2017-10-10 LAB — COMPREHENSIVE METABOLIC PANEL
A/G RATIO: 2.1 (ref 1.2–2.2)
ALBUMIN: 4.8 g/dL (ref 3.6–4.8)
ALT: 15 IU/L (ref 0–32)
AST: 14 IU/L (ref 0–40)
Alkaline Phosphatase: 75 IU/L (ref 39–117)
BUN / CREAT RATIO: 20 (ref 12–28)
BUN: 15 mg/dL (ref 8–27)
Bilirubin Total: 0.5 mg/dL (ref 0.0–1.2)
CALCIUM: 9.9 mg/dL (ref 8.7–10.3)
CO2: 23 mmol/L (ref 20–29)
CREATININE: 0.76 mg/dL (ref 0.57–1.00)
Chloride: 103 mmol/L (ref 96–106)
GFR, EST AFRICAN AMERICAN: 99 mL/min/{1.73_m2} (ref >59)
GFR, EST NON AFRICAN AMERICAN: 86 mL/min/{1.73_m2} (ref >59)
Globulin, Total: 2.3 g/dL (ref 1.5–4.5)
Glucose: 89 mg/dL (ref 65–99)
Potassium: 4.4 mmol/L (ref 3.5–5.2)
Sodium: 141 mmol/L (ref 134–144)
Total Protein: 7.1 g/dL (ref 6.0–8.5)

## 2017-10-10 LAB — CBC WITH DIFFERENTIAL/PLATELET
BASOS: 1 %
Basophils Absolute: 0.1 10*3/uL (ref 0.0–0.2)
EOS (ABSOLUTE): 0.2 10*3/uL (ref 0.0–0.4)
EOS: 2 %
HEMOGLOBIN: 14.4 g/dL (ref 11.1–15.9)
Hematocrit: 41.9 % (ref 34.0–46.6)
IMMATURE GRANULOCYTES: 0 %
Immature Grans (Abs): 0 10*3/uL (ref 0.0–0.1)
LYMPHS ABS: 1.6 10*3/uL (ref 0.7–3.1)
Lymphs: 20 %
MCH: 30.4 pg (ref 26.6–33.0)
MCHC: 34.4 g/dL (ref 31.5–35.7)
MCV: 89 fL (ref 79–97)
MONOCYTES: 9 %
Monocytes Absolute: 0.7 10*3/uL (ref 0.1–0.9)
NEUTROS PCT: 68 %
Neutrophils Absolute: 5.5 10*3/uL (ref 1.4–7.0)
Platelets: 341 10*3/uL (ref 150–379)
RBC: 4.73 x10E6/uL (ref 3.77–5.28)
RDW: 13.3 % (ref 12.3–15.4)
WBC: 8.1 10*3/uL (ref 3.4–10.8)

## 2017-10-10 LAB — TSH: TSH: 2.97 u[IU]/mL (ref 0.450–4.500)

## 2017-10-11 ENCOUNTER — Other Ambulatory Visit: Payer: Self-pay | Admitting: Obstetrics & Gynecology

## 2017-10-11 DIAGNOSIS — Z1231 Encounter for screening mammogram for malignant neoplasm of breast: Secondary | ICD-10-CM

## 2017-10-27 ENCOUNTER — Ambulatory Visit
Admission: RE | Admit: 2017-10-27 | Discharge: 2017-10-27 | Disposition: A | Discharge status: Home / Self Care | Payer: BC Managed Care – PPO | Source: Ambulatory Visit | Attending: Obstetrics & Gynecology | Admitting: Obstetrics & Gynecology

## 2017-10-27 DIAGNOSIS — Z1231 Encounter for screening mammogram for malignant neoplasm of breast: Secondary | ICD-10-CM | POA: Insufficient documentation

## 2018-01-01 ENCOUNTER — Other Ambulatory Visit: Payer: Self-pay

## 2018-01-01 DIAGNOSIS — E7800 Pure hypercholesterolemia, unspecified: Secondary | ICD-10-CM

## 2018-01-01 MED ORDER — ROSUVASTATIN CALCIUM 5 MG PO TABS: 5.0000 mg | ORAL_TABLET | Freq: Every day | ORAL | 12 refills | Status: DC

## 2018-04-04 ENCOUNTER — Other Ambulatory Visit: Payer: Self-pay | Admitting: Family Medicine

## 2018-04-27 ENCOUNTER — Ambulatory Visit
Admission: RE | Admit: 2018-04-27 | Discharge: 2018-04-27 | Disposition: A | Discharge status: Home / Self Care | Payer: BC Managed Care – PPO | Source: Ambulatory Visit | Attending: Gastroenterology | Admitting: Gastroenterology

## 2018-04-27 ENCOUNTER — Ambulatory Visit: Payer: BC Managed Care – PPO | Admitting: Anesthesiology

## 2018-04-27 ENCOUNTER — Encounter: Payer: Self-pay | Admitting: Anesthesiology

## 2018-04-27 ENCOUNTER — Encounter
Admission: RE | Disposition: A | Discharge status: Home / Self Care | Payer: Self-pay | Source: Ambulatory Visit | Attending: Gastroenterology

## 2018-04-27 DIAGNOSIS — D124 Benign neoplasm of descending colon: Secondary | ICD-10-CM | POA: Diagnosis not present

## 2018-04-27 DIAGNOSIS — M1990 Unspecified osteoarthritis, unspecified site: Secondary | ICD-10-CM | POA: Diagnosis not present

## 2018-04-27 DIAGNOSIS — K5730 Diverticulosis of large intestine without perforation or abscess without bleeding: Secondary | ICD-10-CM | POA: Diagnosis not present

## 2018-04-27 DIAGNOSIS — K635 Polyp of colon: Secondary | ICD-10-CM | POA: Insufficient documentation

## 2018-04-27 DIAGNOSIS — Z885 Allergy status to narcotic agent status: Secondary | ICD-10-CM | POA: Diagnosis not present

## 2018-04-27 DIAGNOSIS — Z79899 Other long term (current) drug therapy: Secondary | ICD-10-CM | POA: Diagnosis not present

## 2018-04-27 DIAGNOSIS — E039 Hypothyroidism, unspecified: Secondary | ICD-10-CM | POA: Diagnosis not present

## 2018-04-27 DIAGNOSIS — G4730 Sleep apnea, unspecified: Secondary | ICD-10-CM | POA: Insufficient documentation

## 2018-04-27 DIAGNOSIS — K640 First degree hemorrhoids: Secondary | ICD-10-CM | POA: Diagnosis not present

## 2018-04-27 DIAGNOSIS — F419 Anxiety disorder, unspecified: Secondary | ICD-10-CM | POA: Insufficient documentation

## 2018-04-27 DIAGNOSIS — Z86010 Personal history of colonic polyps: Secondary | ICD-10-CM | POA: Diagnosis not present

## 2018-04-27 DIAGNOSIS — Z1211 Encounter for screening for malignant neoplasm of colon: Secondary | ICD-10-CM | POA: Insufficient documentation

## 2018-04-27 DIAGNOSIS — F329 Major depressive disorder, single episode, unspecified: Secondary | ICD-10-CM | POA: Insufficient documentation

## 2018-04-27 HISTORY — PX: COLONOSCOPY WITH PROPOFOL: SHX5780

## 2018-04-27 SURGERY — COLONOSCOPY WITH PROPOFOL
Anesthesia: General

## 2018-04-27 MED ORDER — SODIUM CHLORIDE 0.9 % IV SOLN
INTRAVENOUS | Status: DC
  Administered 2018-04-27: 1000 mL via INTRAVENOUS

## 2018-04-27 MED ORDER — PROPOFOL 500 MG/50ML IV EMUL
INTRAVENOUS | Status: DC | PRN
  Administered 2018-04-27: 100 ug/kg/min via INTRAVENOUS

## 2018-04-27 MED ORDER — LIDOCAINE HCL (CARDIAC) PF 100 MG/5ML IV SOSY
PREFILLED_SYRINGE | INTRAVENOUS | Status: DC | PRN
  Administered 2018-04-27: 20 mg via INTRATRACHEAL

## 2018-04-27 MED ORDER — LIDOCAINE HCL (PF) 2 % IJ SOLN
INTRAMUSCULAR | Status: AC
  Filled 2018-04-27: qty 10

## 2018-04-27 MED ORDER — PROPOFOL 500 MG/50ML IV EMUL
INTRAVENOUS | Status: AC
Start: 2018-04-27 — End: ?
  Filled 2018-04-27: qty 50

## 2018-04-27 NOTE — Anesthesia Preprocedure Evaluation (Signed)
Anesthesia Evaluation  Patient identified by MRN, date of birth, ID band Patient awake    Reviewed: Allergy & Precautions, NPO status , Patient's Chart, lab work & pertinent test results, reviewed documented beta blocker date and time   Airway Mallampati: III  TM Distance: >3 FB     Dental  (+) Chipped   Pulmonary sleep apnea ,           Cardiovascular      Neuro/Psych PSYCHIATRIC DISORDERS Anxiety Depression    GI/Hepatic   Endo/Other  Hypothyroidism   Renal/GU      Musculoskeletal  (+) Arthritis ,   Abdominal   Peds  Hematology   Anesthesia Other Findings   Reproductive/Obstetrics                             Anesthesia Physical Anesthesia Plan  ASA: III  Anesthesia Plan:    Post-op Pain Management:    Induction: Intravenous  PONV Risk Score and Plan:   Airway Management Planned:   Additional Equipment:   Intra-op Plan:   Post-operative Plan:   Informed Consent: I have reviewed the patients History and Physical, chart, labs and discussed the procedure including the risks, benefits and alternatives for the proposed anesthesia with the patient or authorized representative who has indicated his/her understanding and acceptance.     Plan Discussed with: CRNA  Anesthesia Plan Comments:         Anesthesia Quick Evaluation

## 2018-04-27 NOTE — Transfer of Care (Signed)
Immediate Anesthesia Transfer of Care Note  Patient: Mikayla Munoz  Procedure(s) Performed: COLONOSCOPY WITH PROPOFOL (N/A )  Patient Location: PACU and Endoscopy Unit  Anesthesia Type:General  Level of Consciousness: awake, alert  and oriented  Airway & Oxygen Therapy: Patient Spontanous Breathing and Patient connected to nasal cannula oxygen  Post-op Assessment: Report given to RN and Post -op Vital signs reviewed and stable  Post vital signs: Reviewed and stable  Last Vitals:  Vitals Value Taken Time  BP 86/53 04/27/2018  8:53 AM  Temp 36.1 C 04/27/2018  8:51 AM  Pulse 65 04/27/2018  8:54 AM  Resp 28 04/27/2018  8:54 AM  SpO2 99 % 04/27/2018  8:54 AM  Vitals shown include unvalidated device data.  Last Pain:  Vitals:   04/27/18 0851  TempSrc: Tympanic  PainSc: 0-No pain         Complications: No apparent anesthesia complications

## 2018-04-27 NOTE — H&P (Signed)
Outpatient short stay form Pre-procedure 04/27/2018 8:16 AM Lollie Sails MD  Primary Physician: Dr. Miguel Aschoff  Reason for visit: Colonoscopy  History of present illness: Patient is a 62 year old female presenting today for a screening colonoscopy.  There is a history of colon polyps about 10 years ago. they were hyperplastic.  Tolerating her prep well.  She takes no aspirin or blood thinning agent.    Current Facility-Administered Medications:  .  0.9 %  sodium chloride infusion, , Intravenous, Continuous, Lollie Sails, MD, Last Rate: 20 mL/hr at 04/27/18 0756, 1,000 mL at 04/27/18 0756  Medications Prior to Admission  Medication Sig Dispense Refill Last Dose  . BIOTIN 5000 PO Take by mouth.   04/26/2018 at Unknown time  . Cholecalciferol (VITAMIN D) 2000 UNITS tablet Take by mouth.   04/26/2018 at Unknown time  . ibuprofen (ADVIL,MOTRIN) 800 MG tablet Take 1 tablet (800 mg total) by mouth 3 (three) times daily. 90 tablet 2 Past Week at Unknown time  . loratadine (CLARITIN) 10 MG tablet Take by mouth.   04/26/2018 at Unknown time  . Lutein-Zeaxanthin 25-5 MG CAPS Take by mouth.   04/26/2018 at Unknown time  . MIMVEY 1-0.5 MG tablet    04/26/2018 at Unknown time  . mometasone (NASONEX) 50 MCG/ACT nasal spray Place into the nose.   04/26/2018 at Unknown time  . rosuvastatin (CRESTOR) 5 MG tablet Take 1 tablet (5 mg total) by mouth at bedtime. 30 tablet 12 04/26/2018 at Unknown time  . scopolamine (TRANSDERM-SCOP) 1 MG/3DAYS Place 1 patch (1.5 mg total) onto the skin every 3 (three) days. 4 patch 1 04/26/2018 at Unknown time  . SYNTHROID 50 MCG tablet TAKE ONE TABLET BY MOUTH ONCE DAILY 30 tablet 12 04/26/2018 at Unknown time  . valACYclovir (VALTREX) 500 MG tablet TAKE 1 TABLET BY MOUTH TWICE DAILY 60 tablet 12 04/26/2018 at Unknown time     Allergies  Allergen Reactions  . Codeine   . Expectorant Cough Control  [Guaifenesin]   . Hydrocodone-Acetaminophen       History reviewed. No pertinent past medical history.  Review of systems:      Physical Exam    Heart and lungs: Regular rate and rhythm without rub or gallop, lungs are bilaterally clear.    HEENT: Normocephalic atraumatic eyes are anicteric    Other:    Pertinant exam for procedure: Soft nontender nondistended bowel sounds positive normoactive    Planned proceedures: Colonoscopy and indicated procedures. I have discussed the risks benefits and complications of procedures to include not limited to bleeding, infection, perforation and the risk of sedation and the patient wishes to proceed.    Lollie Sails, MD Gastroenterology 04/27/2018  8:16 AM

## 2018-04-27 NOTE — Anesthesia Post-op Follow-up Note (Signed)
Anesthesia QCDR form completed.        

## 2018-04-27 NOTE — Anesthesia Postprocedure Evaluation (Signed)
Anesthesia Post Note  Patient: Mikayla Munoz  Procedure(s) Performed: COLONOSCOPY WITH PROPOFOL (N/A )  Patient location during evaluation: Endoscopy Anesthesia Type: General Level of consciousness: awake and alert Pain management: pain level controlled Vital Signs Assessment: post-procedure vital signs reviewed and stable Respiratory status: spontaneous breathing, nonlabored ventilation, respiratory function stable and patient connected to nasal cannula oxygen Cardiovascular status: blood pressure returned to baseline and stable Postop Assessment: no apparent nausea or vomiting Anesthetic complications: no     Last Vitals:  Vitals:   04/27/18 0901 04/27/18 0911  BP: 105/68 109/74  Pulse: 65 61  Resp: 17 18  Temp:    SpO2: 100% 100%    Last Pain:  Vitals:   04/27/18 0911  TempSrc:   PainSc: 0-No pain                 Romonia Yanik S

## 2018-04-27 NOTE — Op Note (Addendum)
[facility] Gastroenterology Patient Name: Mikayla Munoz Procedure Date: 04/27/2018 7:58 AM MRN: [id number] Account #: [id number] Date of Birth: 24-Mar-1956 Admit Type: Outpatient Age: 62 Room: [facility] ENDO ROOM 3 Gender: Female Note Status: Finalized Procedure:            Colonoscopy Indications:          Screening for colorectal malignant neoplasm Providers:            Lollie Sails, MD Referring MD:         Janine Ores. Rosanna Randy, MD (Referring MD) Medicines:            Monitored Anesthesia Care Complications:        No immediate complications. Procedure:            Pre-Anesthesia Assessment:                       - ASA Grade Assessment: III - A patient with severe                        systemic disease.                       After obtaining informed consent, the colonoscope was                        passed under direct vision. Throughout the procedure,                        the patient's blood pressure, pulse, and oxygen                        saturations were monitored continuously. The                        Colonoscope was introduced through the anus and                        advanced to the the cecum, identified by appendiceal                        orifice and ileocecal valve. The colonoscopy was                        performed without difficulty. The patient tolerated the                        procedure well. The quality of the bowel preparation                        was good. Findings:      Multiple small-mouthed diverticula were found in the sigmoid colon and       distal descending colon.      Two sessile polyps were found in the descending colon. The polyps were 3       to 4 mm in size. These polyps were removed with a cold snare. Resection       and retrieval were complete.      Two sessile polyps were found in the proximal sigmoid colon. The polyps       were 1 to 2 mm in size. These polyps were removed with a cold biopsy  forceps.  Resection and retrieval were complete.      Non-bleeding internal hemorrhoids were found during retroflexion and       during anoscopy. The hemorrhoids were small and Grade I (internal       hemorrhoids that do not prolapse).      The digital rectal exam was normal.      A single small located deep to the mucosa angioectasia without bleeding       was found in the proximal ascending colon. Impression:           - Diverticulosis in the sigmoid colon and in the distal                        descending colon.                       - Two 3 to 4 mm polyps in the descending colon, removed                        with a cold snare. Resected and retrieved.                       - Two 1 to 2 mm polyps in the proximal sigmoid colon,                        removed with a cold biopsy forceps. Resected and                        retrieved.                       - Non-bleeding internal hemorrhoids. Recommendation:       - Discharge patient to home.                       - Soft diet today, then advance as tolerated to advance                        diet as tolerated.                       - Await pathology results.                       - Telephone GI clinic for pathology results in 1 week. Procedure Code(s):    --- Professional ---                       [phone], Colonoscopy, flexible; with removal of tumor(s),                        polyp(s), or other lesion(s) by snare technique                       [id number], 47, Colonoscopy, flexible; with biopsy, single                        or multiple Diagnosis Code(s):    --- Professional ---                       Z12.11, Encounter for screening for malignant neoplasm  of colon                       K64.0, First degree hemorrhoids                       D12.4, Benign neoplasm of descending colon                       D12.5, Benign neoplasm of sigmoid colon                       K57.30, Diverticulosis of large intestine without                         perforation or abscess without bleeding CPT copyright 2018 American Medical Association. All rights reserved. The codes documented in this report are preliminary and upon coder review may  be revised to meet current compliance requirements. Lollie Sails, MD 04/27/2018 8:49:33 AM This report has been signed electronically. Number of Addenda: 0 Note Initiated On: 04/27/2018 7:58 AM Total Procedure Duration: 0 hours 20 minutes 28 seconds       [facility]

## 2018-04-30 ENCOUNTER — Encounter: Payer: Self-pay | Admitting: Gastroenterology

## 2018-04-30 LAB — SURGICAL PATHOLOGY

## 2018-05-22 ENCOUNTER — Encounter: Payer: Self-pay | Admitting: Family Medicine

## 2018-05-30 ENCOUNTER — Encounter: Payer: BC Managed Care – PPO | Admitting: Family Medicine

## 2018-06-11 ENCOUNTER — Ambulatory Visit: Payer: BC Managed Care – PPO | Admitting: Family Medicine

## 2018-06-11 VITALS — BP 134/78 | HR 55 | Temp 97.7°F | Resp 16 | Wt 196.0 lb

## 2018-06-11 DIAGNOSIS — E039 Hypothyroidism, unspecified: Secondary | ICD-10-CM | POA: Diagnosis not present

## 2018-06-11 DIAGNOSIS — Z6833 Body mass index (BMI) 33.0-33.9, adult: Secondary | ICD-10-CM

## 2018-06-11 DIAGNOSIS — E7800 Pure hypercholesterolemia, unspecified: Secondary | ICD-10-CM | POA: Diagnosis not present

## 2018-06-11 DIAGNOSIS — A6004 Herpesviral vulvovaginitis: Secondary | ICD-10-CM | POA: Diagnosis not present

## 2018-06-11 DIAGNOSIS — E6609 Other obesity due to excess calories: Secondary | ICD-10-CM | POA: Diagnosis not present

## 2018-06-11 NOTE — Progress Notes (Signed)
Mikayla Munoz  MRN: [id number] DOB: 1955/10/21  Subjective:  HPI   Patient states she does not know why she is here today except to get refills on her medications.   She reports she is doing well and working part time. She gets gynecologic care per [facility].  She retired from full-time work 4 years ago and has thoroughly enjoyed it. We were able to stop her antidepressants at that time and she has not needed them.  Overall she feels very well.  Patient Active Problem List   Diagnosis Date Noted  . Adult attention deficit disorder 11/18/2014  . Allergic rhinitis 11/18/2014  . Anxiety 11/18/2014  . Clinical depression 11/18/2014  . Genital herpes 11/18/2014  . Hypercholesteremia 11/18/2014  . Adult hypothyroidism 11/18/2014  . Cannot sleep 11/18/2014  . Arthritis, degenerative 11/18/2014  . Adiposity 11/18/2014  . Obstructive apnea 11/18/2014  . Avitaminosis D 11/18/2014    No past medical history on file.  Social History   Socioeconomic History  . Marital status: Single    Spouse name: Not on file  . Number of children: Not on file  . Years of education: Not on file  . Highest education level: Not on file  Occupational History  . Not on file  Social Needs  . Financial resource strain: Not on file  . Food insecurity:    Worry: Not on file    Inability: Not on file  . Transportation needs:    Medical: Not on file    Non-medical: Not on file  Tobacco Use  . Smoking status: Never Smoker  Substance and Sexual Activity  . Alcohol use: Yes    Comment: Ocassionally  . Drug use: No  . Sexual activity: Not on file  Lifestyle  . Physical activity:    Days per week: Not on file    Minutes per session: Not on file  . Stress: Not on file  Relationships  . Social connections:    Talks on phone: Not on file    Gets together: Not on file    Attends religious service: Not on file    Active member of club or organization: Not on file    Attends meetings of clubs or  organizations: Not on file    Relationship status: Not on file  . Intimate partner violence:    Fear of current or ex partner: Not on file    Emotionally abused: Not on file    Physically abused: Not on file    Forced sexual activity: Not on file  Other Topics Concern  . Not on file  Social History Narrative  . Not on file    Outpatient Encounter Medications as of 06/11/2018  Medication Sig Note  . BIOTIN 5000 PO Take by mouth.   . Cholecalciferol (VITAMIN D) 2000 UNITS tablet Take by mouth. 11/18/2014: Received from: [facility]  . ibuprofen (ADVIL,MOTRIN) 800 MG tablet Take 1 tablet (800 mg total) by mouth 3 (three) times daily.   Marland Kitchen loratadine (CLARITIN) 10 MG tablet Take by mouth. 11/18/2014: Received from: [facility]  . Lutein-Zeaxanthin 25-5 MG CAPS Take by mouth. 11/18/2014: Received from: [facility]  . MIMVEY 1-0.5 MG tablet    . mometasone (NASONEX) 50 MCG/ACT nasal spray Place into the nose. 11/18/2014: Received from: [facility]  . rosuvastatin (CRESTOR) 5 MG tablet Take 1 tablet (5 mg total) by mouth at bedtime.   Marland Kitchen scopolamine (TRANSDERM-SCOP) 1 MG/3DAYS Place 1 patch (1.5 mg  total) onto the skin every 3 (three) days.   Marland Kitchen SYNTHROID 50 MCG tablet TAKE ONE TABLET BY MOUTH ONCE DAILY   . valACYclovir (VALTREX) 500 MG tablet TAKE 1 TABLET BY MOUTH TWICE DAILY    No facility-administered encounter medications on file as of 06/11/2018.     Allergies  Allergen Reactions  . Codeine   . Expectorant Cough Control  [Guaifenesin]   . Hydrocodone-Acetaminophen     Review of Systems  Constitutional: Negative.   Eyes: Negative.   Respiratory: Negative for cough, shortness of breath and wheezing.   Cardiovascular: Negative for chest pain, palpitations, orthopnea, claudication and leg swelling.  Neurological: Negative.   Endo/Heme/Allergies: Negative.   Psychiatric/Behavioral: Negative.     Objective:  There  were no vitals taken for this visit.  Physical Exam  Constitutional: She is oriented to person, place, and time and well-developed, well-nourished, and in no distress.  HENT:  Head: Normocephalic and atraumatic.  Right Ear: External ear normal.  Left Ear: External ear normal.  Nose: Nose normal.  Eyes: Conjunctivae are normal. No scleral icterus.  Neck: No thyromegaly present.  Cardiovascular: Normal rate, regular rhythm and normal heart sounds.  Pulmonary/Chest: Effort normal.  Neurological: She is alert and oriented to person, place, and time. GCS score is 15.  Skin: Skin is warm and dry.  Psychiatric: Mood, memory, affect and judgment normal.    Assessment and Plan :   1. Hypercholesteremia  - Comprehensive metabolic panel - Lipid Panel With LDL/HDL Ratio  2. Herpes simplex vulvovaginitis Just cutting back on acyclovir to daily and then after a few months stopping.  He has not had an outbreak in greater than 10 years.  3. Class 1 obesity due to excess calories without serious comorbidity with body mass index (BMI) of 33.0 to 33.9 in adult Diet and exercise stressed.  She has been working on this.  4. Adult hypothyroidism  I have done the exam and reviewed the chart and it is accurate to the best of my knowledge. Development worker, community has been used and  any errors in dictation or transcription are unintentional. Miguel Aschoff M.D. [facility]

## 2018-06-13 ENCOUNTER — Telehealth: Payer: Self-pay

## 2018-06-13 LAB — LIPID PANEL WITH LDL/HDL RATIO
Cholesterol, Total: 142 mg/dL (ref 100–199)
HDL: 50 mg/dL (ref >39)
LDL Calculated: 75 mg/dL (ref 0–99)
LDL/HDL RATIO: 1.5 ratio (ref 0.0–3.2)
Triglycerides: 85 mg/dL (ref 0–149)
VLDL Cholesterol Cal: 17 mg/dL (ref 5–40)

## 2018-06-13 LAB — COMPREHENSIVE METABOLIC PANEL
A/G RATIO: 2.1 (ref 1.2–2.2)
ALT: 10 IU/L (ref 0–32)
AST: 11 IU/L (ref 0–40)
Albumin: 4.4 g/dL (ref 3.6–4.8)
Alkaline Phosphatase: 72 IU/L (ref 39–117)
BUN/Creatinine Ratio: 15 (ref 12–28)
BUN: 12 mg/dL (ref 8–27)
Bilirubin Total: 0.5 mg/dL (ref 0.0–1.2)
CALCIUM: 9.4 mg/dL (ref 8.7–10.3)
CO2: 21 mmol/L (ref 20–29)
Chloride: 105 mmol/L (ref 96–106)
Creatinine, Ser: 0.78 mg/dL (ref 0.57–1.00)
GFR calc Af Amer: 95 mL/min/{1.73_m2} (ref >59)
GFR, EST NON AFRICAN AMERICAN: 82 mL/min/{1.73_m2} (ref >59)
GLOBULIN, TOTAL: 2.1 g/dL (ref 1.5–4.5)
Glucose: 88 mg/dL (ref 65–99)
POTASSIUM: 4.6 mmol/L (ref 3.5–5.2)
SODIUM: 143 mmol/L (ref 134–144)
TOTAL PROTEIN: 6.5 g/dL (ref 6.0–8.5)

## 2018-06-13 NOTE — Telephone Encounter (Signed)
Advised  ED 

## 2018-06-13 NOTE — Telephone Encounter (Signed)
Left message to call back  

## 2018-06-13 NOTE — Telephone Encounter (Signed)
-----   Message from Jerrol Banana., MD sent at 06/13/2018 10:51 AM EST ----- Labs good.

## 2018-06-28 ENCOUNTER — Telehealth: Payer: Self-pay | Admitting: Family Medicine

## 2018-06-28 NOTE — Telephone Encounter (Signed)
Patient states that she is calling Rachelle back. KW

## 2018-06-28 NOTE — Telephone Encounter (Signed)
Pt asking for Dr. Synthia Innocent nurse to give her a call back.  Did not disclose message.   Thanks, [employer]

## 2018-06-28 NOTE — Telephone Encounter (Signed)
Only time--Augmentin--let her know this is one time only .

## 2018-06-28 NOTE — Telephone Encounter (Signed)
Left message to call back  

## 2018-06-28 NOTE — Telephone Encounter (Signed)
Spoke with the patient, and she reports that she has history of allergies and recurrent sinus infections. She has had nasal congestion, cough, sinus pressure behind her eyes, and upper teeth pain for the last 10 days. She has tried sudafed consistently, takes claritin daily, netty pot at night which helps a little, and nasal spray and her symptoms are still there. She denies fever, but has had chills. She is wanting to know if Dr. Rosanna Randy would send in Augmentin to help with this. 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 Patient uses [facility] Please advise. Thanks!

## 2018-06-29 ENCOUNTER — Other Ambulatory Visit: Payer: Self-pay

## 2018-06-29 MED ORDER — AMOXICILLIN-POT CLAVULANATE 875-125 MG PO TABS: 1.0000 | ORAL_TABLET | Freq: Two times a day (BID) | ORAL | 0 refills | Status: DC

## 2018-06-29 NOTE — Progress Notes (Signed)
done

## 2018-07-23 ENCOUNTER — Other Ambulatory Visit: Payer: Self-pay

## 2018-07-23 MED ORDER — SYNTHROID 50 MCG PO TABS: 50.0000 ug | ORAL_TABLET | Freq: Every day | ORAL | 12 refills | Status: DC

## 2018-07-23 NOTE — Telephone Encounter (Signed)
Pharmacy requesting refills. Please review. Thanks!

## 2018-08-02 ENCOUNTER — Ambulatory Visit: Payer: BC Managed Care – PPO | Admitting: Family Medicine

## 2018-08-02 ENCOUNTER — Encounter: Payer: Self-pay | Admitting: Family Medicine

## 2018-08-02 VITALS — BP 128/60 | HR 84 | Temp 98.3°F | Resp 16 | Wt 206.2 lb

## 2018-08-02 DIAGNOSIS — J069 Acute upper respiratory infection, unspecified: Secondary | ICD-10-CM

## 2018-08-02 MED ORDER — AMOXICILLIN-POT CLAVULANATE 875-125 MG PO TABS: 1.0000 | ORAL_TABLET | Freq: Two times a day (BID) | ORAL | 0 refills | Status: DC

## 2018-08-02 NOTE — Progress Notes (Signed)
Patient: Mikayla Munoz Female    DOB: 1955/10/29   63 y.o.   MRN: [id number] Visit Date: 08/02/2018  Today's Provider: Vernie Murders, PA   Chief Complaint  Patient presents with  . URI   Subjective:     URI   This is a new problem. The current episode started in the past 7 days (Started on Sunday or Monday). The problem has been gradually worsening. There has been no fever. Associated symptoms include congestion, coughing, ear pain, headaches, a plugged ear sensation, rhinorrhea, sinus pain, sneezing, a sore throat and wheezing. Pertinent negatives include no chest pain. Treatments tried: Mucinex, Robitussin DM. The treatment provided no relief.    History reviewed. No pertinent past medical history.  Patient Active Problem List   Diagnosis Date Noted  . Adult attention deficit disorder 11/18/2014  . Allergic rhinitis 11/18/2014  . Anxiety 11/18/2014  . Clinical depression 11/18/2014  . Genital herpes 11/18/2014  . Hypercholesteremia 11/18/2014  . Adult hypothyroidism 11/18/2014  . Cannot sleep 11/18/2014  . Arthritis, degenerative 11/18/2014  . Adiposity 11/18/2014  . Obstructive apnea 11/18/2014  . Avitaminosis D 11/18/2014   Past Surgical History:  Procedure Laterality Date  . CARPAL TUNNEL RELEASE    . CHOLECYSTECTOMY    . COLONOSCOPY WITH PROPOFOL N/A 04/27/2018   Procedure: COLONOSCOPY WITH PROPOFOL;  Surgeon: Lollie Sails, MD;  Location: [facility] ENDOSCOPY;  Service: Endoscopy;  Laterality: N/A;  . DILATION AND CURETTAGE OF UTERUS    . LASIK    . TONSILLECTOMY AND ADENOIDECTOMY     Family History  Problem Relation Age of Onset  . Diabetes Mother   . Hypertension Mother   . Depression Mother   . Asthma Mother   . COPD Mother   . Macular degeneration Mother   . Heart disease Father   . Heart attack Father   . Lung cancer Father   . Aneurysm Father   . Cirrhosis Sister   . Alcohol abuse Sister   . Diabetes Maternal Uncle   . Heart disease  Maternal Uncle   . Uterine cancer Maternal Grandmother   . Throat cancer Maternal Grandfather   . Arthritis Paternal Grandmother   . Stroke Paternal Grandmother   . Heart attack Paternal Grandfather   . Breast cancer Neg Hx    Allergies  Allergen Reactions  . Codeine   . Expectorant Cough Control  [Guaifenesin]   . Hydrocodone-Acetaminophen     Current Outpatient Medications:  .  BIOTIN 5000 PO, Take by mouth., Disp: , Rfl:  .  Cholecalciferol (VITAMIN D) 2000 UNITS tablet, Take by mouth., Disp: , Rfl:  .  ibuprofen (ADVIL,MOTRIN) 800 MG tablet, Take 1 tablet (800 mg total) by mouth 3 (three) times daily., Disp: 90 tablet, Rfl: 2 .  loratadine (CLARITIN) 10 MG tablet, Take by mouth., Disp: , Rfl:  .  Lutein-Zeaxanthin 25-5 MG CAPS, Take by mouth., Disp: , Rfl:  .  MIMVEY 1-0.5 MG tablet, , Disp: , Rfl:  .  mometasone (NASONEX) 50 MCG/ACT nasal spray, Place into the nose., Disp: , Rfl:  .  rosuvastatin (CRESTOR) 5 MG tablet, Take 1 tablet (5 mg total) by mouth at bedtime., Disp: 30 tablet, Rfl: 12 .  SYNTHROID 50 MCG tablet, Take 1 tablet (50 mcg total) by mouth daily., Disp: 30 tablet, Rfl: 12 .  valACYclovir (VALTREX) 500 MG tablet, TAKE 1 TABLET BY MOUTH TWICE DAILY, Disp: 60 tablet, Rfl: 12  Review of Systems  Constitutional:  Negative for fever.  HENT: Positive for congestion, ear pain, postnasal drip, rhinorrhea, sinus pressure, sinus pain, sneezing and sore throat.   Respiratory: Positive for cough, chest tightness, shortness of breath and wheezing.   Cardiovascular: Negative for chest pain, palpitations and leg swelling.  Neurological: Positive for headaches.   Social History   Tobacco Use  . Smoking status: Never Smoker  Substance Use Topics  . Alcohol use: Yes    Comment: Ocassionally     Objective:   BP 128/60 (BP Location: Right Arm, Patient Position: Sitting, Cuff Size: Large)   Pulse 84   Temp 98.3 F (36.8 C) (Oral)   Resp 16   Wt 206 lb 3.2 oz (93.5 kg)    SpO2 98%   BMI 35.39 kg/m  Vitals:   08/02/18 1357  BP: 128/60  Pulse: 84  Resp: 16  Temp: 98.3 F (36.8 C)  TempSrc: Oral  SpO2: 98%  Weight: 206 lb 3.2 oz (93.5 kg)   Physical Exam Constitutional:      General: She is not in acute distress.    Appearance: She is well-developed.  HENT:     Head: Normocephalic and atraumatic.     Right Ear: Hearing, tympanic membrane and ear canal normal.     Left Ear: Hearing, tympanic membrane and ear canal normal.     Nose: Nose normal.     Mouth/Throat:     Pharynx: Oropharynx is clear.  Eyes:     General: Lids are normal. No scleral icterus.       Right eye: No discharge.        Left eye: No discharge.     Conjunctiva/sclera: Conjunctivae normal.  Cardiovascular:     Rate and Rhythm: Normal rate and regular rhythm.     Heart sounds: Normal heart sounds.  Pulmonary:     Effort: Pulmonary effort is normal. No respiratory distress.     Breath sounds: Normal breath sounds. No wheezing, rhonchi or rales.  Abdominal:     Palpations: Abdomen is soft.  Musculoskeletal: Normal range of motion.  Skin:    Findings: No lesion or rash.  Neurological:     Mental Status: She is alert and oriented to person, place, and time.  Psychiatric:        Speech: Speech normal.        Behavior: Behavior normal.        Thought Content: Thought content normal.      Assessment & Plan    1. URI with cough and congestion Onset 07-29-18 with cough that progressed to sore throat, stuffy head and rhinorrhea. Minimal sputum production and some sinus tenderness occasionally. No fever. Increase fluid intake and treat with Augmentin. May use Mucinex-DM, Claritin and Flonase. Recheck prn. - amoxicillin-clavulanate (AUGMENTIN) 875-125 MG tablet; Take 1 tablet by mouth 2 (two) times daily.  Dispense: 20 tablet; Refill: [place], [place]  [facility]

## 2018-12-20 ENCOUNTER — Other Ambulatory Visit: Payer: Self-pay | Admitting: Obstetrics & Gynecology

## 2018-12-20 DIAGNOSIS — Z1231 Encounter for screening mammogram for malignant neoplasm of breast: Secondary | ICD-10-CM

## 2018-12-21 ENCOUNTER — Ambulatory Visit
Admission: RE | Admit: 2018-12-21 | Discharge: 2018-12-21 | Disposition: A | Discharge status: Home / Self Care | Payer: BC Managed Care – PPO | Source: Ambulatory Visit | Attending: Obstetrics & Gynecology | Admitting: Obstetrics & Gynecology

## 2018-12-21 ENCOUNTER — Other Ambulatory Visit: Payer: Self-pay

## 2018-12-21 DIAGNOSIS — Z1231 Encounter for screening mammogram for malignant neoplasm of breast: Secondary | ICD-10-CM | POA: Diagnosis not present

## 2018-12-25 ENCOUNTER — Other Ambulatory Visit: Payer: Self-pay | Admitting: Obstetrics & Gynecology

## 2018-12-25 DIAGNOSIS — R921 Mammographic calcification found on diagnostic imaging of breast: Secondary | ICD-10-CM

## 2018-12-25 DIAGNOSIS — R928 Other abnormal and inconclusive findings on diagnostic imaging of breast: Secondary | ICD-10-CM

## 2018-12-31 ENCOUNTER — Ambulatory Visit
Admission: RE | Admit: 2018-12-31 | Discharge: 2018-12-31 | Disposition: A | Discharge status: Home / Self Care | Payer: BC Managed Care – PPO | Source: Ambulatory Visit | Attending: Obstetrics & Gynecology | Admitting: Obstetrics & Gynecology

## 2018-12-31 ENCOUNTER — Other Ambulatory Visit: Payer: Self-pay | Admitting: Obstetrics & Gynecology

## 2018-12-31 ENCOUNTER — Other Ambulatory Visit: Payer: Self-pay

## 2018-12-31 DIAGNOSIS — R921 Mammographic calcification found on diagnostic imaging of breast: Secondary | ICD-10-CM

## 2018-12-31 DIAGNOSIS — R928 Other abnormal and inconclusive findings on diagnostic imaging of breast: Secondary | ICD-10-CM | POA: Insufficient documentation

## 2019-01-02 ENCOUNTER — Other Ambulatory Visit: Payer: Self-pay | Admitting: Obstetrics & Gynecology

## 2019-01-02 DIAGNOSIS — R928 Other abnormal and inconclusive findings on diagnostic imaging of breast: Secondary | ICD-10-CM

## 2019-01-02 DIAGNOSIS — R921 Mammographic calcification found on diagnostic imaging of breast: Secondary | ICD-10-CM

## 2019-01-08 ENCOUNTER — Ambulatory Visit
Admission: RE | Admit: 2019-01-08 | Discharge: 2019-01-08 | Disposition: A | Discharge status: Home / Self Care | Payer: BC Managed Care – PPO | Source: Ambulatory Visit | Attending: Obstetrics & Gynecology | Admitting: Obstetrics & Gynecology

## 2019-01-08 ENCOUNTER — Other Ambulatory Visit: Payer: Self-pay | Admitting: Obstetrics & Gynecology

## 2019-01-08 ENCOUNTER — Other Ambulatory Visit: Payer: Self-pay

## 2019-01-08 DIAGNOSIS — R921 Mammographic calcification found on diagnostic imaging of breast: Secondary | ICD-10-CM

## 2019-01-08 DIAGNOSIS — R928 Other abnormal and inconclusive findings on diagnostic imaging of breast: Secondary | ICD-10-CM

## 2019-01-08 HISTORY — PX: BREAST BIOPSY: SHX20

## 2019-01-09 LAB — SURGICAL PATHOLOGY

## 2019-01-10 ENCOUNTER — Telehealth: Payer: Self-pay

## 2019-01-10 NOTE — Telephone Encounter (Signed)
Patient is calling that she is due to have a Lumpectomy and would like to to know who Dr.Gilbert recommends to do her surgery.   CB# [phone]

## 2019-01-10 NOTE — Telephone Encounter (Signed)
Patient notified

## 2019-01-10 NOTE — Telephone Encounter (Signed)
Please advise 

## 2019-01-10 NOTE — Telephone Encounter (Signed)
Dr Bary Castilla

## 2019-01-22 ENCOUNTER — Encounter: Payer: Self-pay | Admitting: General Surgery

## 2019-01-22 ENCOUNTER — Ambulatory Visit: Payer: BC Managed Care – PPO | Admitting: General Surgery

## 2019-01-22 ENCOUNTER — Other Ambulatory Visit: Payer: Self-pay

## 2019-01-22 VITALS — BP 118/87 | HR 85 | Temp 97.7°F | Resp 16 | Ht 65.0 in | Wt 209.4 lb

## 2019-01-22 DIAGNOSIS — N6092 Unspecified benign mammary dysplasia of left breast: Secondary | ICD-10-CM | POA: Diagnosis not present

## 2019-01-22 NOTE — Progress Notes (Signed)
Patient ID: Mikayla Munoz, female   DOB: [birth year], 63 y.o.   MRN: [id number]  Chief Complaint  Patient presents with  . New Patient (Initial Visit)    HPI Mikayla Munoz is a 63 y.o. female.  Left breast sclerosing changes. Found on annual mammogram.  Patient does not do self breast exam.  The patient was not aware of any changes in her breast.  She is accompanied by a close friend who was present for the interview and exam. HPI  History reviewed. No pertinent past medical history.  Past Surgical History:  Procedure Laterality Date  . BREAST BIOPSY Left 01/08/2019   stereo calcs path pending x clip  . BREAST BIOPSY Left 01/08/2019   stereo calcs path pending coil clip   . CARPAL TUNNEL RELEASE    . CHOLECYSTECTOMY    . COLONOSCOPY WITH PROPOFOL N/A 04/27/2018   Procedure: COLONOSCOPY WITH PROPOFOL;  Surgeon: Lollie Sails, MD;  Location: [facility] ENDOSCOPY;  Service: Endoscopy;  Laterality: N/A;  . DILATION AND CURETTAGE OF UTERUS    . LASIK    . TONSILLECTOMY AND ADENOIDECTOMY      Family History  Problem Relation Age of Onset  . Diabetes Mother   . Hypertension Mother   . Depression Mother   . Asthma Mother   . COPD Mother   . Macular degeneration Mother   . Heart disease Father   . Heart attack Father   . Lung cancer Father   . Aneurysm Father   . Cancer - Lung Father   . Cirrhosis Sister   . Alcohol abuse Sister   . Diabetes Maternal Uncle   . Heart disease Maternal Uncle   . Uterine cancer Maternal Grandmother   . Throat cancer Maternal Grandfather   . Arthritis Paternal Grandmother   . Stroke Paternal Grandmother   . Heart attack Paternal Grandfather   . Breast cancer Neg Hx     Social History Social History   Tobacco Use  . Smoking status: Never Smoker  . Smokeless tobacco: Never Used  Substance Use Topics  . Alcohol use: Yes    Comment: Ocassionally  . Drug use: No    Allergies  Allergen Reactions  . Codeine   . Expectorant Cough Control   [Guaifenesin]   . Hydrocodone-Acetaminophen     Current Outpatient Medications  Medication Sig Dispense Refill  . BIOTIN 5000 PO Take by mouth.    . Cholecalciferol (VITAMIN D) 2000 UNITS tablet Take by mouth.    Marland Kitchen ibuprofen (ADVIL,MOTRIN) 800 MG tablet Take 1 tablet (800 mg total) by mouth 3 (three) times daily. 90 tablet 2  . loratadine (CLARITIN) 10 MG tablet Take by mouth.    . Lutein-Zeaxanthin 25-5 MG CAPS Take by mouth.    . mometasone (NASONEX) 50 MCG/ACT nasal spray Place into the nose.    . rosuvastatin (CRESTOR) 5 MG tablet Take 1 tablet (5 mg total) by mouth at bedtime. 30 tablet 12  . SYNTHROID 50 MCG tablet Take 1 tablet (50 mcg total) by mouth daily. 30 tablet 12  . valACYclovir (VALTREX) 500 MG tablet TAKE 1 TABLET BY MOUTH TWICE DAILY 60 tablet 12   No current facility-administered medications for this visit.     Review of Systems Review of Systems  Constitutional: Negative.   Respiratory: Negative.   Cardiovascular: Negative.     Blood pressure 118/87, pulse 85, temperature 97.7 F (36.5 C), temperature source Temporal, resp. rate 16, height 5\' 5"  (1.651 m), weight  209 lb 6.4 oz (95 kg), SpO2 98 %.  Physical Exam Physical Exam Constitutional:      Appearance: Normal appearance. She is well-developed.  Eyes:     Conjunctiva/sclera: Conjunctivae normal.  Neck:     Musculoskeletal: Normal range of motion.  Cardiovascular:     Rate and Rhythm: Normal rate and regular rhythm.     Heart sounds: Normal heart sounds.  Pulmonary:     Effort: Pulmonary effort is normal.     Breath sounds: Normal breath sounds.  Chest:     Breasts:        Right: Normal.     Lymphadenopathy:     Upper Body:     Right upper body: No supraclavicular or axillary adenopathy.     Left upper body: No supraclavicular or axillary adenopathy.  Skin:    General: Skin is warm and dry.  Neurological:     Mental Status: She is alert and oriented to person, place, and time.      Data Reviewed Imaging studies from October 27, 2017 through January 08, 2019 were independently reviewed.  DIAGNOSIS:  A. BREAST, LEFT UPPER OUTER QUADRANT; STEREOTACTIC BIOPSY:  - BENIGN MAMMARY PARENCHYMA WITH FIBROCYSTIC AND FIBROADENOMATOID  CHANGES, WITH ASSOCIATED CALCIFICATIONS.  - NEGATIVE FOR ATYPICAL PROLIFERATIVE BREAST DISEASE.   B. BREAST, LEFT LOWER INNER QUADRANT; STEREOTACTIC BIOPSY:  - SCLEROSING LESION WITH FIBROCYSTIC CHANGES WITH FOCAL ATYPICAL DUCTAL  HYPERPLASIA (ADH) AND FOCAL INTRADUCTAL PAPILLARY CHANGES, WITH  ASSOCIATED CALCIFICATIONS  - NEGATIVE FORCARCINOMA IN SITU AND MALIGNANCY. COIL CLIP  Assessment Mammogram detected ADH.  Plan  Indications for reexcision of the lower inner quadrant of the left breast to evaluate for possible upstaging (15%) to DCIS or less likely invasive cancer was reviewed.  Role of chemoprevention if no upstaging to minimize the risk of recurrent changes was reviewed.  Mikayla Munoz model risk calculation shows a 3.9 risk for malignancy over the next 5 years, 17% lifetime.  The patient reports presently experiencing estrogen deprivation symptoms since discontinuing her previous hormone supplements.  Whether this would be exacerbated with antiestrogen therapy is certainly unknown, but possible.  I encourage all women to at least try estrogen suppression to see if they can tolerate it, but for 2% absolute risk reduction if this produces unacceptable vasomotor symptoms omission would not be wrong.  The patient will be scheduled for wire localization and excision at a convenient date.  HPI, Physical Exam, Assessment and Plan have been scribed under the direction and in the presence of Mikayla Bellow, MD. Mikayla Munoz, CMA  I have completed the exam and reviewed the above documentation for accuracy and completeness.  I agree with the above.  Mikayla Munoz has been used and any errors in dictation or transcription are  unintentional.  Hervey Ard, M.D., F.A.C.S.  Mikayla Munoz 01/23/2019, 4:54 PM

## 2019-01-23 DIAGNOSIS — N6092 Unspecified benign mammary dysplasia of left breast: Secondary | ICD-10-CM | POA: Insufficient documentation

## 2019-01-26 ENCOUNTER — Other Ambulatory Visit: Payer: Self-pay | Admitting: Family Medicine

## 2019-01-26 DIAGNOSIS — E7800 Pure hypercholesterolemia, unspecified: Secondary | ICD-10-CM

## 2019-01-31 ENCOUNTER — Ambulatory Visit: Payer: Self-pay | Admitting: Surgery

## 2019-01-31 NOTE — H&P (Signed)
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 female who was referred by Juliane Lack Ward, MD for evaluation of above. Change was noted on last screening mammogram. Patient does not routinely do self breast exams.  Age of menarche was 84. Age of menopause was 24s.   Patient reports hormonal therapy. Patient is G0P0. Patient denies nipple discharge. Patient denies previous breast biopsy. Patient denies a personal history of breast cancer.   Past Medical History:  has a past medical history of Allergic rhinitis due to pollen, Depression, History of HPV infection, Hyperlipidemia, and Obstructive apnea (11/18/2014).  Past Surgical History:  has a past surgical history that includes Adenoidectomy (1965); Endoscopic Carpal Tunnel Release (Right, 01/2010); Cervical cone biopsy (03/2009); photorefractive keratotomy/lasik (Bilateral, 2004); Tonsillectomy (1965); Colonoscopy (02/08/2008); Cholecystectomy (2004); and Colonoscopy (04/27/2018).  Family History: family history includes Diabetes in her mother; Heart disease in her father; High blood pressure (Hypertension) in her mother; Lung cancer in her father; Uterine cancer in her maternal grandmother. SCC-mother  Social History:  reports that she has never smoked. She has never used smokeless tobacco. She reports current alcohol use. She reports that she does not use drugs.  Current Medications: has a current medication list which includes the following prescription(s): biotin, cholecalciferol, fluticasone propionate, levothyroxine, loratadine, lutein, rosuvastatin, triamcinolone, and valacyclovir.  Allergies:  Allergies as of 01/31/2019 - Reviewed 10/25/2018  Allergen Reaction Noted  . Codeine Other (See Comments) 11/18/2014  . Guaifenesin Other (See Comments) 11/18/2014  . Hydrocodone-acetaminophen Other (See Comments) 11/18/2014    ROS:  A 15 point review of systems was performed and was negative except as noted  in HPI   Objective:     BP 133/89   Pulse 76   Ht 162.6 cm (5\' 4" )   Wt 95.3 kg (210 lb 3.2 oz)   BMI 36.08 kg/m   Constitutional :    Lymphatics/Throat:  no asymmetry, masses, or scars  Respiratory:  clear to auscultation bilaterally  Cardiovascular:  regular rate and rhythm  Gastrointestinal: soft, non-tender; bowel sounds normal; no masses,  no organomegaly.   Musculoskeletal: Steady gait and movement  Skin: Cool and moist  Psychiatric: Normal affect, non-agitated, not confused  Breast:  Chaperone present for exam.  breasts appear normal, no suspicious masses, no skin or nipple changes or axillary nodes.    LABS:  n/a   RADS: Addendum by Nolon Nations, MD on 01/14/2019  1:38 PM ADDENDUM REPORT: 01/14/2019 09:18  ADDENDUM: PATHOLOGY: A. BREAST, LEFT UPPER OUTER QUADRANT; STEREOTACTIC BIOPSY: BENIGN MAMMARY PARENCHYMA WITH FIBROCYSTIC AND FIBROADENOMATOID CHANGES, WITH ASSOCIATED CALCIFICATIONS. NEGATIVE FOR ATYPICAL PROLIFERATIVE BREAST DISEASE.  B. BREAST, LEFT LOWER INNER QUADRANT; STEREOTACTIC BIOPSY: SCLEROSING LESION WITH FIBROCYSTIC CHANGES WITH FOCAL ATYPICAL DUCTAL HYPERPLASIA (ADH) AND FOCAL INTRADUCTAL PAPILLARY CHANGES, WITH ASSOCIATED CALCIFICATIONS. NEGATIVE FOR CARCINOMA IN SITU AND MALIGNANCY.  CONCORDANT: Both biopsies found to be concordant by Dr. Nolon Nations.  I telephoned the patient on 01/10/2019 at 1030 and discussed these results and the recommendations stated below. All questions were answered. The patient denies significant pain or bleeding from the biopsy site. Biopsy site care instructions were reviewed and the patient was asked to call [facility] with any questions or issues related to the biopsy.  RECOMMENDATION: Surgical excision for site B- left lower inner quadrant calcifications. Request for referral was relayed to [facility], CN-BI on 01/10/2019.  Electa Sniff RN 01/14/2019 @ [phone].   Electronically Signed   By: Nolon Nations  M.D.  On: 01/14/2019 09:18  Result Narrative  CLINICAL DATA: The patient presents for stereotactic guided core biopsy of 2 sites of calcifications in the LEFT breast.  EXAM: LEFT BREAST STEREOTACTIC CORE NEEDLE BIOPSY x2  COMPARISON: Previous exams.  FINDINGS: Prior to the procedure additional views are performed of the LEFT breast, confirming presence of 2 groups of microcalcifications. The first is in the [place] of the LEFT breast measuring 4 millimeters. The second is in the [place] of the LEFT breast, measuring 9 millimeters. Both biopsies are performed today.  The patient and I discussed the procedure of stereotactic-guided biopsy including benefits and alternatives. We discussed the high likelihood of a successful procedure. We discussed the risks of the procedure including infection, bleeding, tissue injury, clip migration, and inadequate sampling. Informed written consent was given. The usual time out protocol was performed immediately prior to the procedure.  Site 1: UPPER-OUTER QUADRANT LEFT breast. X shaped clip.  Using sterile technique and 1% Lidocaine as local anesthetic, under stereotactic guidance, a 9 gauge vacuum assisted device was used to perform core needle biopsy of calcifications in the UPPER-OUTER QUADRANT of the LEFT breast using a craniocaudal approach. Specimen radiograph was performed showing numerous specimens with calcifications. Specimens with calcifications are identified for pathology. An X shaped clip was placed at the biopsy site following biopsy.  Lesion quadrant: UPPER-OUTER QUADRANT LEFT breast  Site 2: LOWER INNER QUADRANT LEFT breast. Coil shaped clip.  Using sterile technique and 1% lidocaine as local anesthetic, under stereotactic guidance, a 9 gauge vacuum assisted device was used to perform core needle biopsy of calcifications in the LOWER INNER QUADRANT of the LEFT breast  using MEDIAL to the LATERAL approach. Specimen radiograph was performed showing calcifications in numerous staples. Specimens with calcifications are identified for pathology.  Lesion QUADRANT: LOWER INNER QUADRANT LEFT breast.  Follow-up 2-view mammogram was performed and dictated separately.  IMPRESSION: Stereotactic-guided biopsy of 2 sites of calcifications in the LEFT breast. No apparent complications.  Electronically Signed: By: Nolon Nations M.D. On: 01/08/2019 10:09  CLINICAL DATA: Recall from screening for left breast microcalcifications.  EXAM: DIGITAL DIAGNOSTIC UNILATERAL LEFT MAMMOGRAM WITH CAD AND TOMO  COMPARISON: Previous exam(s).  ACR Breast Density Category b: There are scattered areas of fibroglandular density.  FINDINGS: Spot magnification and true lateral images demonstrate a group of punctate and amorphous microcalcifications over the outer lower quadrant of the left breast spanning 4 x 7 x 9 mm.  Mammographic images were processed with CAD.  IMPRESSION: 9 mm group of indeterminate microcalcifications over the outer lower left breast.  RECOMMENDATION: Recommend stereotactic core needle biopsy for further evaluation.  I have discussed the findings and recommendations with the patient. Results were also provided in writing at the conclusion of the visit. If applicable, a reminder letter will be sent to the patient regarding the next appointment.  BI-RADS CATEGORY 4: Suspicious.  Biopsy will be scheduled here at the [facility] prior to patient's departure.   Electronically Signed  By: Marin Olp M.D.  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 The risks of general anesthetic, if used, includes MI, CVA, sudden death or even reaction to anesthetic medications also discussed.  Typical post-op  recovery time and possbility of activity restrictions were also discussed.  Alternatives include continued observation.  Benefits include possible symptom relief, pathologic evaluation, and/or curative excision.   The patient verbalized understanding and all questions were answered to the patient's satisfaction.  2. Patient has elected to proceed with surgical treatment due to increased risk for breast CA as well as possibility of final pathology being upgraded.   Procedure will be scheduled.  Written consent was obtained.  Currently, does not wish to pursue hormone treatment.  LEFT breast lumpectomy with needle loc  OSA- increased perioperative risk.  Pt supposedly had issues "waking up" from anesthesia where she required overnight stay in the hospital for monitoring. Never given specific diagnosis.  She also mentions increased hair loss after each procedure she has undergone.  I explained I have not heard nor know of any specfic medications given that can cause such effects.

## 2019-01-31 NOTE — H&P (View-Only) (Signed)
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 female who was referred by Juliane Lack Ward, MD for evaluation of above. Change was noted on last screening mammogram. Patient does not routinely do self breast exams.  Age of menarche was 89. Age of menopause was 2s.   Patient reports hormonal therapy. Patient is G0P0. Patient denies nipple discharge. Patient denies previous breast biopsy. Patient denies a personal history of breast cancer.   Past Medical History:  has a past medical history of Allergic rhinitis due to pollen, Depression, History of HPV infection, Hyperlipidemia, and Obstructive apnea (11/18/2014).  Past Surgical History:  has a past surgical history that includes Adenoidectomy (1965); Endoscopic Carpal Tunnel Release (Right, 01/2010); Cervical cone biopsy (03/2009); photorefractive keratotomy/lasik (Bilateral, 2004); Tonsillectomy (1965); Colonoscopy (02/08/2008); Cholecystectomy (2004); and Colonoscopy (04/27/2018).  Family History: family history includes Diabetes in her mother; Heart disease in her father; High blood pressure (Hypertension) in her mother; Lung cancer in her father; Uterine cancer in her maternal grandmother. SCC-mother  Social History:  reports that she has never smoked. She has never used smokeless tobacco. She reports current alcohol use. She reports that she does not use drugs.  Current Medications: has a current medication list which includes the following prescription(s): biotin, cholecalciferol, fluticasone propionate, levothyroxine, loratadine, lutein, rosuvastatin, triamcinolone, and valacyclovir.  Allergies:  Allergies as of 01/31/2019 - Reviewed 10/25/2018  Allergen Reaction Noted  . Codeine Other (See Comments) 11/18/2014  . Guaifenesin Other (See Comments) 11/18/2014  . Hydrocodone-acetaminophen Other (See Comments) 11/18/2014    ROS:  A 15 point review of systems was performed and was negative except as noted  in HPI   Objective:     BP 133/89   Pulse 76   Ht 162.6 cm (5\' 4" )   Wt 95.3 kg (210 lb 3.2 oz)   BMI 36.08 kg/m   Constitutional :    Lymphatics/Throat:  no asymmetry, masses, or scars  Respiratory:  clear to auscultation bilaterally  Cardiovascular:  regular rate and rhythm  Gastrointestinal: soft, non-tender; bowel sounds normal; no masses,  no organomegaly.   Musculoskeletal: Steady gait and movement  Skin: Cool and moist  Psychiatric: Normal affect, non-agitated, not confused  Breast:  Chaperone present for exam.  breasts appear normal, no suspicious masses, no skin or nipple changes or axillary nodes.    LABS:  n/a   RADS: Addendum by Nolon Nations, MD on 01/14/2019  1:38 PM ADDENDUM REPORT: 01/14/2019 09:18  ADDENDUM: PATHOLOGY: A. BREAST, LEFT UPPER OUTER QUADRANT; STEREOTACTIC BIOPSY: BENIGN MAMMARY PARENCHYMA WITH FIBROCYSTIC AND FIBROADENOMATOID CHANGES, WITH ASSOCIATED CALCIFICATIONS. NEGATIVE FOR ATYPICAL PROLIFERATIVE BREAST DISEASE.  B. BREAST, LEFT LOWER INNER QUADRANT; STEREOTACTIC BIOPSY: SCLEROSING LESION WITH FIBROCYSTIC CHANGES WITH FOCAL ATYPICAL DUCTAL HYPERPLASIA (ADH) AND FOCAL INTRADUCTAL PAPILLARY CHANGES, WITH ASSOCIATED CALCIFICATIONS. NEGATIVE FOR CARCINOMA IN SITU AND MALIGNANCY.  CONCORDANT: Both biopsies found to be concordant by Dr. Nolon Nations.  I telephoned the patient on 01/10/2019 at 1030 and discussed these results and the recommendations stated below. All questions were answered. The patient denies significant pain or bleeding from the biopsy site. Biopsy site care instructions were reviewed and the patient was asked to call [facility] with any questions or issues related to the biopsy.  RECOMMENDATION: Surgical excision for site B- left lower inner quadrant calcifications. Request for referral was relayed to [facility], CN-BI on 01/10/2019.  Electa Sniff RN 01/14/2019 @ [phone].   Electronically Signed   By: Nolon Nations  M.D.  On: 01/14/2019 09:18  Result Narrative  CLINICAL DATA: The patient presents for stereotactic guided core biopsy of 2 sites of calcifications in the LEFT breast.  EXAM: LEFT BREAST STEREOTACTIC CORE NEEDLE BIOPSY x2  COMPARISON: Previous exams.  FINDINGS: Prior to the procedure additional views are performed of the LEFT breast, confirming presence of 2 groups of microcalcifications. The first is in the [place] of the LEFT breast measuring 4 millimeters. The second is in the [place] of the LEFT breast, measuring 9 millimeters. Both biopsies are performed today.  The patient and I discussed the procedure of stereotactic-guided biopsy including benefits and alternatives. We discussed the high likelihood of a successful procedure. We discussed the risks of the procedure including infection, bleeding, tissue injury, clip migration, and inadequate sampling. Informed written consent was given. The usual time out protocol was performed immediately prior to the procedure.  Site 1: UPPER-OUTER QUADRANT LEFT breast. X shaped clip.  Using sterile technique and 1% Lidocaine as local anesthetic, under stereotactic guidance, a 9 gauge vacuum assisted device was used to perform core needle biopsy of calcifications in the UPPER-OUTER QUADRANT of the LEFT breast using a craniocaudal approach. Specimen radiograph was performed showing numerous specimens with calcifications. Specimens with calcifications are identified for pathology. An X shaped clip was placed at the biopsy site following biopsy.  Lesion quadrant: UPPER-OUTER QUADRANT LEFT breast  Site 2: LOWER INNER QUADRANT LEFT breast. Coil shaped clip.  Using sterile technique and 1% lidocaine as local anesthetic, under stereotactic guidance, a 9 gauge vacuum assisted device was used to perform core needle biopsy of calcifications in the LOWER INNER QUADRANT of the LEFT breast  using MEDIAL to the LATERAL approach. Specimen radiograph was performed showing calcifications in numerous staples. Specimens with calcifications are identified for pathology.  Lesion QUADRANT: LOWER INNER QUADRANT LEFT breast.  Follow-up 2-view mammogram was performed and dictated separately.  IMPRESSION: Stereotactic-guided biopsy of 2 sites of calcifications in the LEFT breast. No apparent complications.  Electronically Signed: By: Nolon Nations M.D. On: 01/08/2019 10:09  CLINICAL DATA: Recall from screening for left breast microcalcifications.  EXAM: DIGITAL DIAGNOSTIC UNILATERAL LEFT MAMMOGRAM WITH CAD AND TOMO  COMPARISON: Previous exam(s).  ACR Breast Density Category b: There are scattered areas of fibroglandular density.  FINDINGS: Spot magnification and true lateral images demonstrate a group of punctate and amorphous microcalcifications over the outer lower quadrant of the left breast spanning 4 x 7 x 9 mm.  Mammographic images were processed with CAD.  IMPRESSION: 9 mm group of indeterminate microcalcifications over the outer lower left breast.  RECOMMENDATION: Recommend stereotactic core needle biopsy for further evaluation.  I have discussed the findings and recommendations with the patient. Results were also provided in writing at the conclusion of the visit. If applicable, a reminder letter will be sent to the patient regarding the next appointment.  BI-RADS CATEGORY 4: Suspicious.  Biopsy will be scheduled here at the [facility] prior to patient's departure.   Electronically Signed  By: Marin Olp M.D.  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 The risks of general anesthetic, if used, includes MI, CVA, sudden death or even reaction to anesthetic medications also discussed.  Typical post-op  recovery time and possbility of activity restrictions were also discussed.  Alternatives include continued observation.  Benefits include possible symptom relief, pathologic evaluation, and/or curative excision.   The patient verbalized understanding and all questions were answered to the patient's satisfaction.  2. Patient has elected to proceed with surgical treatment due to increased risk for breast CA as well as possibility of final pathology being upgraded.   Procedure will be scheduled.  Written consent was obtained.  Currently, does not wish to pursue hormone treatment.  LEFT breast lumpectomy with needle loc  OSA- increased perioperative risk.  Pt supposedly had issues "waking up" from anesthesia where she required overnight stay in the hospital for monitoring. Never given specific diagnosis.  She also mentions increased hair loss after each procedure she has undergone.  I explained I have not heard nor know of any specfic medications given that can cause such effects.

## 2019-02-04 ENCOUNTER — Other Ambulatory Visit: Payer: Self-pay | Admitting: Surgery

## 2019-02-04 DIAGNOSIS — D242 Benign neoplasm of left breast: Secondary | ICD-10-CM

## 2019-02-11 ENCOUNTER — Other Ambulatory Visit: Payer: Self-pay

## 2019-02-11 ENCOUNTER — Encounter
Admission: RE | Admit: 2019-02-11 | Discharge: 2019-02-11 | Disposition: A | Discharge status: Home / Self Care | Payer: BC Managed Care – PPO | Source: Ambulatory Visit | Attending: Surgery | Admitting: Surgery

## 2019-02-11 DIAGNOSIS — Z20828 Contact with and (suspected) exposure to other viral communicable diseases: Secondary | ICD-10-CM | POA: Diagnosis not present

## 2019-02-11 DIAGNOSIS — Z01812 Encounter for preprocedural laboratory examination: Secondary | ICD-10-CM | POA: Diagnosis present

## 2019-02-11 HISTORY — DX: Hypothyroidism, unspecified: E03.9

## 2019-02-11 HISTORY — DX: Other complications of anesthesia, initial encounter: T88.59XA

## 2019-02-11 HISTORY — DX: Other specified postprocedural states: Z98.890

## 2019-02-11 HISTORY — DX: Other specified postprocedural states: R11.2

## 2019-02-11 LAB — SARS CORONAVIRUS 2 (TAT 6-24 HRS): SARS Coronavirus 2: NEGATIVE

## 2019-02-11 NOTE — Patient Instructions (Signed)
Your procedure is scheduled on: Thurs 02/14/19 Report to [place]. To find out your arrival time please call [phone] between 1PM - 3PM on Wed 02/13/19.  Remember: Instructions that are not followed completely may result in serious medical risk, up to and including death, or upon the discretion of your surgeon and anesthesiologist your surgery may need to be rescheduled.     _X__ 1. Do not eat food after midnight the night before your procedure.                 No gum chewing or hard candies. You may drink clear liquids up to 2 hours                 before you are scheduled to arrive for your surgery- DO not drink clear                 liquids within 2 hours of the start of your surgery.                 Clear Liquids include:  water, apple juice without pulp, clear carbohydrate                 drink such as Clearfast or Gatorade, Black Coffee or Tea (Do not add                 anything to coffee or tea). Diabetics water only  __X__2.  On the morning of surgery brush your teeth with toothpaste and water, you                 may rinse your mouth with mouthwash if you wish.  Do not swallow any              toothpaste of mouthwash.     _X__ 3.  No Alcohol for 24 hours before or after surgery.   _X__ 4.  Do Not Smoke or use e-cigarettes For 24 Hours Prior to Your Surgery.                 Do not use any chewable tobacco products for at least 6 hours prior to                 surgery.  ____  5.  Bring all medications with you on the day of surgery if instructed.   __X__  6.  Notify your doctor if there is any change in your medical condition      (cold, fever, infections).     Do not wear jewelry, make-up, hairpins, clips or nail polish. Do not wear lotions, powders, or perfumes.  Do not shave 48 hours prior to surgery. Men may shave face and neck. Do not bring valuables to the hospital.    [facility] is not responsible for any  belongings or valuables.  Contacts, dentures/partials or body piercings may not be worn into surgery. Bring a case for your contacts, glasses or hearing aids, a denture cup will be supplied. Leave your suitcase in the car. After surgery it may be brought to your room. For patients admitted to the hospital, discharge time is determined by your treatment team.   Patients discharged the day of surgery will not be allowed to drive home.   Please read over the following fact sheets that you were given:   MRSA Information  __X__ Take these medicines the morning of surgery with A SIP OF WATER:  1. loratadine (CLARITIN)  2. SYNTHROID   3.   4.  5.  6.  ____ Fleet Enema (as directed)   __X__ Use CHG Soap/SAGE wipes as directed  ____ Use inhalers on the day of surgery  ____ Stop metformin/Janumet/Farxiga 2 days prior to surgery    ____ Take 1/2 of usual insulin dose the night before surgery. No insulin the morning          of surgery.   ____ Stop Blood Thinners Coumadin/Plavix/Xarelto/Pleta/Pradaxa/Eliquis/Effient/Aspirin  on   Or contact your Surgeon, Cardiologist or Medical Doctor regarding  ability to stop your blood thinners  __X__ Stop Anti-inflammatories 7 days before surgery such as Advil, Ibuprofen, Motrin,  BC or Goodies Powder, Naprosyn, Naproxen, Aleve, Aspirin    __X__ Stop all herbal supplements, fish oil or vitamin E until after surgery.    ____ Bring C-Pap to the hospital.

## 2019-02-14 ENCOUNTER — Ambulatory Visit
Admission: RE | Admit: 2019-02-14 | Discharge: 2019-02-14 | Disposition: A | Discharge status: Home / Self Care | Payer: BC Managed Care – PPO | Source: Ambulatory Visit | Attending: Surgery | Admitting: Surgery

## 2019-02-14 ENCOUNTER — Encounter: Payer: Self-pay | Admitting: *Deleted

## 2019-02-14 ENCOUNTER — Ambulatory Visit: Payer: BC Managed Care – PPO | Admitting: Anesthesiology

## 2019-02-14 ENCOUNTER — Encounter
Admission: RE | Disposition: A | Discharge status: Home / Self Care | Payer: Self-pay | Source: Ambulatory Visit | Attending: Surgery

## 2019-02-14 DIAGNOSIS — Z888 Allergy status to other drugs, medicaments and biological substances status: Secondary | ICD-10-CM | POA: Insufficient documentation

## 2019-02-14 DIAGNOSIS — N6489 Other specified disorders of breast: Secondary | ICD-10-CM | POA: Diagnosis not present

## 2019-02-14 DIAGNOSIS — E785 Hyperlipidemia, unspecified: Secondary | ICD-10-CM | POA: Diagnosis not present

## 2019-02-14 DIAGNOSIS — E7800 Pure hypercholesterolemia, unspecified: Secondary | ICD-10-CM

## 2019-02-14 DIAGNOSIS — Z79890 Hormone replacement therapy (postmenopausal): Secondary | ICD-10-CM | POA: Insufficient documentation

## 2019-02-14 DIAGNOSIS — N6092 Unspecified benign mammary dysplasia of left breast: Secondary | ICD-10-CM | POA: Insufficient documentation

## 2019-02-14 DIAGNOSIS — E039 Hypothyroidism, unspecified: Secondary | ICD-10-CM | POA: Diagnosis not present

## 2019-02-14 DIAGNOSIS — R921 Mammographic calcification found on diagnostic imaging of breast: Secondary | ICD-10-CM | POA: Insufficient documentation

## 2019-02-14 DIAGNOSIS — Z79899 Other long term (current) drug therapy: Secondary | ICD-10-CM | POA: Insufficient documentation

## 2019-02-14 DIAGNOSIS — G4733 Obstructive sleep apnea (adult) (pediatric): Secondary | ICD-10-CM | POA: Insufficient documentation

## 2019-02-14 DIAGNOSIS — D242 Benign neoplasm of left breast: Secondary | ICD-10-CM | POA: Insufficient documentation

## 2019-02-14 DIAGNOSIS — Z885 Allergy status to narcotic agent status: Secondary | ICD-10-CM | POA: Insufficient documentation

## 2019-02-14 HISTORY — PX: BREAST LUMPECTOMY: SHX2

## 2019-02-14 HISTORY — PX: PARTIAL MASTECTOMY WITH NEEDLE LOCALIZATION: SHX6008

## 2019-02-14 SURGERY — PARTIAL MASTECTOMY WITH NEEDLE LOCALIZATION
Anesthesia: General | Laterality: Left

## 2019-02-14 MED ORDER — LIDOCAINE-EPINEPHRINE (PF) 1 %-1:200000 IJ SOLN
INTRAMUSCULAR | Status: DC | PRN
  Administered 2019-02-14: 3 mL

## 2019-02-14 MED ORDER — LIDOCAINE HCL (PF) 2 % IJ SOLN
INTRAMUSCULAR | Status: AC
  Filled 2019-02-14: qty 10

## 2019-02-14 MED ORDER — LIDOCAINE HCL (CARDIAC) PF 100 MG/5ML IV SOSY
PREFILLED_SYRINGE | INTRAVENOUS | Status: DC | PRN
  Administered 2019-02-14: 100 mg via INTRAVENOUS

## 2019-02-14 MED ORDER — DEXAMETHASONE SODIUM PHOSPHATE 10 MG/ML IJ SOLN
INTRAMUSCULAR | Status: DC | PRN
  Administered 2019-02-14: 10 mg via INTRAVENOUS

## 2019-02-14 MED ORDER — PROPOFOL 10 MG/ML IV BOLUS
INTRAVENOUS | Status: DC | PRN
  Administered 2019-02-14: 170 mg via INTRAVENOUS

## 2019-02-14 MED ORDER — FAMOTIDINE 20 MG PO TABS
ORAL_TABLET | ORAL | Status: AC
  Administered 2019-02-14: 20 mg via ORAL
  Filled 2019-02-14: qty 1

## 2019-02-14 MED ORDER — FENTANYL CITRATE (PF) 100 MCG/2ML IJ SOLN
INTRAMUSCULAR | Status: AC
  Filled 2019-02-14: qty 2

## 2019-02-14 MED ORDER — SUCCINYLCHOLINE CHLORIDE 20 MG/ML IJ SOLN
INTRAMUSCULAR | Status: AC
  Filled 2019-02-14: qty 1

## 2019-02-14 MED ORDER — PROMETHAZINE HCL 25 MG/ML IJ SOLN
INTRAMUSCULAR | Status: AC
  Filled 2019-02-14: qty 1

## 2019-02-14 MED ORDER — PHENYLEPHRINE HCL (PRESSORS) 10 MG/ML IV SOLN
INTRAVENOUS | Status: DC | PRN
  Administered 2019-02-14 (×2): 50 ug via INTRAVENOUS
  Administered 2019-02-14 (×2): 100 ug via INTRAVENOUS
  Administered 2019-02-14: 50 ug via INTRAVENOUS

## 2019-02-14 MED ORDER — DEXAMETHASONE SODIUM PHOSPHATE 10 MG/ML IJ SOLN
INTRAMUSCULAR | Status: AC
  Filled 2019-02-14: qty 1

## 2019-02-14 MED ORDER — VALACYCLOVIR HCL 500 MG PO TABS: 500.0000 mg | ORAL_TABLET | Freq: Every day | ORAL | Status: DC

## 2019-02-14 MED ORDER — ACETAMINOPHEN 325 MG PO TABS: 650.0000 mg | ORAL_TABLET | Freq: Three times a day (TID) | ORAL | 0 refills | Status: AC | PRN

## 2019-02-14 MED ORDER — FAMOTIDINE 20 MG PO TABS
20.0000 mg | ORAL_TABLET | Freq: Once | ORAL | Status: AC
  Administered 2019-02-14: 20 mg via ORAL

## 2019-02-14 MED ORDER — SCOPOLAMINE 1 MG/3DAYS TD PT72
1.0000 | MEDICATED_PATCH | TRANSDERMAL | Status: DC
  Administered 2019-02-14: 1.5 mg via TRANSDERMAL

## 2019-02-14 MED ORDER — DOCUSATE SODIUM 100 MG PO CAPS: 100.0000 mg | ORAL_CAPSULE | Freq: Two times a day (BID) | ORAL | 0 refills | Status: AC | PRN

## 2019-02-14 MED ORDER — ONDANSETRON HCL 4 MG/2ML IJ SOLN
INTRAMUSCULAR | Status: AC
  Filled 2019-02-14: qty 2

## 2019-02-14 MED ORDER — BUPIVACAINE HCL 0.5 % IJ SOLN
INTRAMUSCULAR | Status: DC | PRN
  Administered 2019-02-14: 3 mL

## 2019-02-14 MED ORDER — IBUPROFEN 800 MG PO TABS: 800.0000 mg | ORAL_TABLET | Freq: Three times a day (TID) | ORAL | 0 refills | Status: DC | PRN

## 2019-02-14 MED ORDER — ONDANSETRON HCL 4 MG/2ML IJ SOLN: 4.0000 mg | Freq: Once | INTRAMUSCULAR | Status: DC | PRN

## 2019-02-14 MED ORDER — LACTATED RINGERS IV SOLN
INTRAVENOUS | Status: DC
  Administered 2019-02-14: 13:00:00 via INTRAVENOUS

## 2019-02-14 MED ORDER — CEFAZOLIN SODIUM-DEXTROSE 2-4 GM/100ML-% IV SOLN
2.0000 g | INTRAVENOUS | Status: AC
  Administered 2019-02-14: 2 g via INTRAVENOUS

## 2019-02-14 MED ORDER — ROCURONIUM BROMIDE 50 MG/5ML IV SOLN
INTRAVENOUS | Status: AC
  Filled 2019-02-14: qty 1

## 2019-02-14 MED ORDER — KETOROLAC TROMETHAMINE 30 MG/ML IJ SOLN
INTRAMUSCULAR | Status: AC
  Filled 2019-02-14: qty 1

## 2019-02-14 MED ORDER — ROSUVASTATIN CALCIUM 5 MG PO TABS: 5.0000 mg | ORAL_TABLET | Freq: Every day | ORAL | Status: DC

## 2019-02-14 MED ORDER — TRAMADOL HCL 50 MG PO TABS: 50.0000 mg | ORAL_TABLET | Freq: Four times a day (QID) | ORAL | 0 refills | Status: DC | PRN

## 2019-02-14 MED ORDER — SCOPOLAMINE 1 MG/3DAYS TD PT72
MEDICATED_PATCH | TRANSDERMAL | Status: AC
  Administered 2019-02-14: 1.5 mg via TRANSDERMAL
  Filled 2019-02-14: qty 1

## 2019-02-14 MED ORDER — ONDANSETRON HCL 4 MG/2ML IJ SOLN
INTRAMUSCULAR | Status: DC | PRN
  Administered 2019-02-14: 4 mg via INTRAVENOUS

## 2019-02-14 MED ORDER — FENTANYL CITRATE (PF) 100 MCG/2ML IJ SOLN
INTRAMUSCULAR | Status: DC | PRN
  Administered 2019-02-14 (×3): 25 ug via INTRAVENOUS
  Administered 2019-02-14: 50 ug via INTRAVENOUS
  Administered 2019-02-14: 25 ug via INTRAVENOUS

## 2019-02-14 MED ORDER — PROMETHAZINE HCL 25 MG/ML IJ SOLN
6.2500 mg | Freq: Once | INTRAMUSCULAR | Status: AC
  Administered 2019-02-14: 6.25 mg via INTRAVENOUS

## 2019-02-14 MED ORDER — ACETAMINOPHEN 10 MG/ML IV SOLN
INTRAVENOUS | Status: AC
  Filled 2019-02-14: qty 100

## 2019-02-14 MED ORDER — CEFAZOLIN SODIUM-DEXTROSE 2-4 GM/100ML-% IV SOLN
INTRAVENOUS | Status: AC
  Filled 2019-02-14: qty 100

## 2019-02-14 MED ORDER — FENTANYL CITRATE (PF) 100 MCG/2ML IJ SOLN: 25.0000 ug | INTRAMUSCULAR | Status: DC | PRN

## 2019-02-14 MED ORDER — ACETAMINOPHEN 10 MG/ML IV SOLN
INTRAVENOUS | Status: DC | PRN
  Administered 2019-02-14: 1000 mg via INTRAVENOUS

## 2019-02-14 MED ORDER — CHLORHEXIDINE GLUCONATE CLOTH 2 % EX PADS
6.0000 | MEDICATED_PAD | Freq: Once | CUTANEOUS | Status: AC
  Administered 2019-02-14: 13:00:00 6 via TOPICAL

## 2019-02-14 SURGICAL SUPPLY — 48 items
ADH SKN CLS APL DERMABOND .7 (GAUZE/BANDAGES/DRESSINGS) ×1
APL PRP STRL LF DISP 70% ISPRP (MISCELLANEOUS) ×1
APPLIER CLIP 11 MED OPEN (CLIP)
APR CLP MED 11 20 MLT OPN (CLIP)
BLADE SURG 15 STRL LF DISP TIS (BLADE) ×1 IMPLANT
BLADE SURG 15 STRL SS (BLADE) ×3
CANISTER SUCT 1200ML W/VALVE (MISCELLANEOUS) ×3 IMPLANT
CHLORAPREP W/TINT 26 (MISCELLANEOUS) ×3 IMPLANT
CLIP APPLIE 11 MED OPEN (CLIP) IMPLANT
CNTNR SPEC 2.5X3XGRAD LEK (MISCELLANEOUS) ×1
CONT SPEC 4OZ STER OR WHT (MISCELLANEOUS) ×2
CONT SPEC 4OZ STRL OR WHT (MISCELLANEOUS) ×1
CONTAINER SPEC 2.5X3XGRAD LEK (MISCELLANEOUS) ×1 IMPLANT
COVER WAND RF STERILE (DRAPES) ×3 IMPLANT
DERMABOND ADVANCED (GAUZE/BANDAGES/DRESSINGS) ×2
DERMABOND ADVANCED .7 DNX12 (GAUZE/BANDAGES/DRESSINGS) ×1 IMPLANT
DEVICE DUBIN SPECIMEN MAMMOGRA (MISCELLANEOUS) ×3 IMPLANT
DRAPE 3/4 80X56 (DRAPES) ×3 IMPLANT
DRAPE CHEST BREAST 77X106 FENE (MISCELLANEOUS) IMPLANT
DRAPE LAPAROTOMY 77X122 PED (DRAPES) ×3 IMPLANT
ELECT CAUTERY BLADE TIP 2.5 (TIP) ×3
ELECT REM PT RETURN 9FT ADLT (ELECTROSURGICAL) ×3
ELECTRODE CAUTERY BLDE TIP 2.5 (TIP) ×1 IMPLANT
ELECTRODE REM PT RTRN 9FT ADLT (ELECTROSURGICAL) ×1 IMPLANT
GAUZE SPONGE 4X4 12PLY STRL (GAUZE/BANDAGES/DRESSINGS) ×3 IMPLANT
GLOVE BIOGEL PI IND STRL 7.0 (GLOVE) ×1 IMPLANT
GLOVE BIOGEL PI INDICATOR 7.0 (GLOVE) ×2
GLOVE SURG SYN 6.5 ES PF (GLOVE) ×6 IMPLANT
GLOVE SURG SYN 6.5 PF PI (GLOVE) ×2 IMPLANT
GOWN STRL REUS W/ TWL LRG LVL3 (GOWN DISPOSABLE) ×3 IMPLANT
GOWN STRL REUS W/TWL LRG LVL3 (GOWN DISPOSABLE) ×9
JACKSON PRATT 10 (INSTRUMENTS) IMPLANT
KIT TURNOVER KIT A (KITS) ×3 IMPLANT
LABEL OR SOLS (LABEL) ×3 IMPLANT
LIGHT WAVEGUIDE WIDE FLAT (MISCELLANEOUS) IMPLANT
NEEDLE HYPO 25X1 1.5 SAFETY (NEEDLE) ×6 IMPLANT
PACK BASIN MINOR ARMC (MISCELLANEOUS) ×3 IMPLANT
SUT MNCRL 4-0 (SUTURE) ×6
SUT MNCRL 4-0 27XMFL (SUTURE) ×2
SUT SILK 2 0 (SUTURE) ×3
SUT SILK 2 0 SH (SUTURE) ×3 IMPLANT
SUT SILK 2-0 30XBRD TIE 12 (SUTURE) ×1 IMPLANT
SUT SILK 3 0 12 30 (SUTURE) ×3 IMPLANT
SUT VIC AB 3-0 SH 27 (SUTURE) ×6
SUT VIC AB 3-0 SH 27X BRD (SUTURE) ×2 IMPLANT
SUTURE MNCRL 4-0 27XMF (SUTURE) ×2 IMPLANT
SYR 10ML LL (SYRINGE) ×3 IMPLANT
WATER STERILE IRR 1000ML POUR (IV SOLUTION) ×3 IMPLANT

## 2019-02-14 NOTE — Transfer of Care (Signed)
Immediate Anesthesia Transfer of Care Note  Patient: Mikayla Munoz  Procedure(s) Performed: PARTIAL MASTECTOMY WITH NEEDLE LOCALIZATION, LEFT (Left )  Patient Location: PACU  Anesthesia Type:General  Level of Consciousness: drowsy  Airway & Oxygen Therapy: Patient Spontanous Breathing and Patient connected to face mask oxygen  Post-op Assessment: Report given to RN and Post -op Vital signs reviewed and stable  Post vital signs: Reviewed and stable  Last Vitals:  Vitals Value Taken Time  BP 129/76 02/14/19 1447  Temp    Pulse 74 02/14/19 1447  Resp 10 02/14/19 1447  SpO2 97 % 02/14/19 1447  Vitals shown include unvalidated device data.  Last Pain:  Vitals:   02/14/19 1213  TempSrc: Temporal  PainSc: 0-No pain         Complications: No apparent anesthesia complications

## 2019-02-14 NOTE — Addendum Note (Signed)
Addendum  created 02/14/19 1538 by Gunnar Fusi, MD   Order list changed

## 2019-02-14 NOTE — Anesthesia Procedure Notes (Signed)
Procedure Name: LMA Insertion Performed by: Demetrius Charity, CRNA Pre-anesthesia Checklist: Patient identified, Patient being monitored, Timeout performed, Emergency Drugs available and Suction available Patient Re-evaluated:Patient Re-evaluated prior to induction Oxygen Delivery Method: Circle system utilized Preoxygenation: Pre-oxygenation with 100% oxygen Induction Type: IV induction Ventilation: Mask ventilation without difficulty LMA: LMA inserted LMA Size: 4.0 Tube type: Oral Number of attempts: 1 Placement Confirmation: positive ETCO2 and breath sounds checked- equal and bilateral Tube secured with: Tape Dental Injury: Teeth and Oropharynx as per pre-operative assessment

## 2019-02-14 NOTE — Interval H&P Note (Signed)
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  The various methods of treatment have been discussed with the patient and family. After consideration of risks, benefits and other options for treatment, the patient has consented to  Procedure(s): PARTIAL MASTECTOMY WITH NEEDLE LOCALIZATION, LEFT (Left) as a surgical intervention.  The patient's history has been reviewed, patient examined, no change in status, stable for surgery.  I have reviewed the patient's chart and labs.  Questions were answered to the patient's satisfaction.     Jarae Panas Lysle Pearl

## 2019-02-14 NOTE — Anesthesia Post-op Follow-up Note (Signed)
Anesthesia QCDR form completed.        

## 2019-02-14 NOTE — Anesthesia Preprocedure Evaluation (Addendum)
Anesthesia Evaluation  Patient identified by MRN, date of birth, ID band Patient awake    Reviewed: Allergy & Precautions, H&P , NPO status , Patient's Chart, lab work & pertinent test results  History of Anesthesia Complications (+) PONV and history of anesthetic complications (PONV and "slow to wake up, had to be admitted")  Airway Mallampati: III       Dental   Pulmonary sleep apnea (not diagnosed) , neg COPD, Not current smoker,           Cardiovascular (-) hypertension(-) Past MI and (-) CHF (-) dysrhythmias (-) Valvular Problems/Murmurs     Neuro/Psych neg Seizures PSYCHIATRIC DISORDERS Anxiety Depression    GI/Hepatic Neg liver ROS, neg GERD  ,  Endo/Other  neg diabetesHypothyroidism   Renal/GU negative Renal ROS     Musculoskeletal   Abdominal   Peds  Hematology   Anesthesia Other Findings Obese  Reproductive/Obstetrics                           Anesthesia Physical Anesthesia Plan  ASA: II  Anesthesia Plan: General LMA   Post-op Pain Management:    Induction:   PONV Risk Score and Plan: 4 or greater and Dexamethasone, Ondansetron, Midazolam and Scopolamine patch - Pre-op  Airway Management Planned: Oral ETT and LMA  Additional Equipment:   Intra-op Plan:   Post-operative Plan:   Informed Consent: I have reviewed the patients History and Physical, chart, labs and discussed the procedure including the risks, benefits and alternatives for the proposed anesthesia with the patient or authorized representative who has indicated his/her understanding and acceptance.       Plan Discussed with:   Anesthesia Plan Comments:       Anesthesia Quick Evaluation

## 2019-02-14 NOTE — Anesthesia Postprocedure Evaluation (Signed)
Anesthesia Post Note  Patient: Mikayla Munoz  Procedure(s) Performed: PARTIAL MASTECTOMY WITH NEEDLE LOCALIZATION, LEFT (Left )  Patient location during evaluation: PACU Anesthesia Type: General Level of consciousness: awake and alert Pain management: pain level controlled Vital Signs Assessment: post-procedure vital signs reviewed and stable Respiratory status: spontaneous breathing and respiratory function stable Cardiovascular status: stable Anesthetic complications: no     Last Vitals:  Vitals:   02/14/19 1445 02/14/19 1515  BP: 129/76 132/74  Pulse: 78 71  Resp: 11 14  Temp:    SpO2: 100% 95%    Last Pain:  Vitals:   02/14/19 1515  TempSrc:   PainSc: 0-No pain                 KEPHART,WILLIAM K

## 2019-02-14 NOTE — Interval H&P Note (Signed)
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  The various methods of treatment have been discussed with the patient and family. After consideration of risks, benefits and other options for treatment, the patient has consented to  Procedure(s): PARTIAL MASTECTOMY WITH NEEDLE LOCALIZATION, LEFT (Left) as a surgical intervention.  The patient's history has been reviewed, patient examined, no change in status, stable for surgery.  I have reviewed the patient's chart and labs.  Questions were answered to the patient's satisfaction.     [name]

## 2019-02-14 NOTE — Discharge Instructions (Signed)
AMBULATORY SURGERY  DISCHARGE INSTRUCTIONS   1) The drugs that you were given will stay in your system until tomorrow so for the next 24 hours you should not:  A) Drive an automobile B) Make any legal decisions C) Drink any alcoholic beverage   2) You may resume regular meals tomorrow.  Today it is better to start with liquids and gradually work up to solid foods.  You may eat anything you prefer, but it is better to start with liquids, then soup and crackers, and gradually work up to solid foods.   3) Please notify your doctor immediately if you have any unusual bleeding, trouble breathing, redness and pain at the surgery site, drainage, fever, or pain not relieved by medication.    4) Additional Instructions:        Please contact your physician with any problems or Same Day Surgery at [phone], Monday through Friday 6 am to 4 pm, or [place] at [facility] number at [phone].Lump Removal, Care After This sheet gives you information about how to care for yourself after your procedure. Your health care provider may also give you more specific instructions. If you have problems or questions, contact your health care provider. What can I expect after the procedure? After the procedure, it is common to have:  Soreness.  Bruising.  Itching. Follow these instructions at home: site care Follow instructions from your health care provider about how to take care of your site. Make sure you:  Wash your hands with soap and water before and after you change your bandage (dressing). If soap and water are not available, use hand sanitizer.  Leave stitches (sutures), skin glue, or adhesive strips in place. These skin closures may need to stay in place for 2 weeks or longer. If adhesive strip edges start to loosen and curl up, you may trim the loose edges. Do not remove adhesive strips completely unless your health care provider tells you to do that.  If the area bleeds or  bruises, apply gentle pressure for 10 minutes.  OK TO SHOWER IN 24HRS  Check your site every day for signs of infection. Check for:  Redness, swelling, or pain.  Fluid or blood.  Warmth.  Pus or a bad smell.  General instructions  Rest and then return to your normal activities as told by your health care provider.   tylenol and advil as needed for discomfort.  Please alternate between the two every four hours as needed for pain.     Use narcotics, if prescribed, only when tylenol and motrin is not enough to control pain.   325-650mg  every 8hrs to max of 3000mg /24hrs for the tylenol.     Advil up to 800mg  per dose every 8hrs as needed for pain.    Keep all follow-up visits as told by your health care provider. This is important. Contact a health care provider if:  You have redness, swelling, or pain around your site.  You have fluid or blood coming from your site.  Your biopsy site feels warm to the touch.  You have pus or a bad smell coming from your site.  You have a fever.  Your sutures, skin glue, or adhesive strips loosen or come off sooner than expected. Get help right away if:  You have bleeding that does not stop with pressure or a dressing. Summary  After the procedure, it is common to have some soreness, bruising, and itching at the site.  Follow instructions from your  health care provider about how to take care of your site.  Check your site every day for signs of infection.  Contact a health care provider if you have redness, swelling, or pain around your site, or your site feels warm to the touch.  Keep all follow-up visits as told by your health care provider. This is important. This information is not intended to replace advice given to you by your health care provider. Make sure you discuss any questions you have with your health care provider. Document Released: 07/24/2015 Document Revised: 12/25/2017 Document Reviewed: 12/25/2017 Elsevier  Interactive Patient Education  [employer].

## 2019-02-15 NOTE — Op Note (Signed)
Preoperative diagnosis: Left breast atypical hyperplasia and papilloma  Postoperative diagnosis: Left breast atypical hyperplasia and papilloma.   Procedure: needle-localized left breast partial mastectomy.                      Anesthesia: GETA  Surgeon: Dr. Benjamine Sprague  Wound Classification: Clean  Indications: Patient is a 63 y.o. female with a nonpalpable left breast mass noted on mammography with core biopsy demonstrating atypical hyperplasia and papilloma requires needle-localized partial mastectomy for treatment with sentinel lymph node biopsy.   Specimen: left Breast mass,  Complications: None  Estimated Blood Loss: 84mL  Findings: 1. Specimen mammography shows marker and wire on specimen 2. Pathology call refers gross examination of margins was negative 3. No other palpable mass or lymph node identified.   Description of procedure: Preoperative needle localization was performed by radiology.  Localization studies were reviewed. The patient was taken to the operating room and placed supine on the operating table, and after general anesthesia the left breast and axilla were prepped and draped in the usual sterile fashion. A time-out was completed verifying correct patient, procedure, site, positioning, and implant(s) and/or special equipment prior to beginning this procedure.  By comparing the localization studies with the direction and skin entry site of the needle, the probable trajectory and location of the mass was visualized. A skin incision was planned in such a way as to minimize the amount of dissection to reach the mass.   The skin incision was made after infusion of local. Flaps were raised and the location of the wire confirmed. The wire was delivered into the wound. Sharp and blunt dissection was then taken down to the mass in breast tissue, measuring 3cm x 3cm x 2.5cm, taking care to include the entire localizing needle and a margin of grossly normal tissue. The  specimen and entire localizing wire were removed. The specimen was oriented with long lateral, short superior, deep double sutures and sent to radiology with the localization studies. Confirmation was received that the entire target lesion had been resected. Pathology called and confirmed margins grossly negative.    Wound irrigated, hemostasis was achieved and the wound closed in layers with  interrupted sutures of 3-0 Vicryl in deep dermal layer and a running subcuticular suture of Monocryl 4-0, then dressed with dermabond. The patient tolerated the procedure well and was taken to the postanesthesia care unit in stable condition. Sponge and instrument count correct at end of procedure.

## 2019-02-20 LAB — SURGICAL PATHOLOGY

## 2019-04-26 ENCOUNTER — Other Ambulatory Visit: Payer: Self-pay | Admitting: Family Medicine

## 2019-04-26 DIAGNOSIS — E7800 Pure hypercholesterolemia, unspecified: Secondary | ICD-10-CM

## 2019-04-26 NOTE — Telephone Encounter (Signed)
[facility] faxed refill request for the following medications: ? ?rosuvastatin (CRESTOR) 5 MG tablet  ? ?Please advise. ? ?

## 2019-04-28 ENCOUNTER — Encounter: Payer: Self-pay | Admitting: Surgery

## 2019-04-29 ENCOUNTER — Other Ambulatory Visit: Payer: Self-pay | Admitting: Family Medicine

## 2019-04-29 MED ORDER — ROSUVASTATIN CALCIUM 5 MG PO TABS: 5.0000 mg | ORAL_TABLET | Freq: Every day | ORAL | 3 refills | Status: DC

## 2019-04-29 NOTE — Telephone Encounter (Signed)
Please advise refill? 

## 2019-04-29 NOTE — Telephone Encounter (Signed)
Please review. Thanks!  

## 2019-05-12 HISTORY — PX: CATARACT EXTRACTION: SUR2

## 2019-05-12 HISTORY — PX: EYE SURGERY: SHX253

## 2019-06-02 ENCOUNTER — Other Ambulatory Visit: Payer: Self-pay | Admitting: Family Medicine

## 2019-06-08 ENCOUNTER — Other Ambulatory Visit: Payer: Self-pay | Admitting: Family Medicine

## 2019-06-08 NOTE — Telephone Encounter (Signed)
Requested medication (s) are due for refill today: yes  Requested medication (s) are on the active medication list: yes  Last refill:  02/25/2019  Future visit scheduled: no  Notes to clinic:  LOV-08/02/2018 Review for refill Last filled by a different provider other than pcp   Requested Prescriptions  Pending Prescriptions Disp Refills   valACYclovir (VALTREX) 500 MG tablet [Pharmacy Med Name: valACYclovir HCl 500 MG Oral Tablet] 60 tablet 0    Sig: Take 1 tablet by mouth twice daily     Antimicrobials:  Antiviral Agents - Anti-Herpetic Failed - 06/08/2019 10:24 AM      Failed - Valid encounter within last 12 months    Recent Outpatient Visits          10 months ago URI with cough and congestion   [facility], Vickki Muff, [place]   12 months ago Hypercholesteremia   [facility] Jerrol Banana., MD   1 year ago Other depression   [facility] Jerrol Banana., MD   2 years ago Other depression   [facility] Jerrol Banana., MD   2 years ago Seasonal allergic rhinitis due to pollen   [facility] Rosanna Randy, Retia Passe., MD

## 2019-08-06 ENCOUNTER — Emergency Department: Payer: BC Managed Care – PPO

## 2019-08-06 ENCOUNTER — Emergency Department
Admission: EM | Admit: 2019-08-06 | Discharge: 2019-08-06 | Disposition: A | Discharge status: Home / Self Care | Payer: BC Managed Care – PPO | Attending: Emergency Medicine | Admitting: Emergency Medicine

## 2019-08-06 ENCOUNTER — Encounter: Payer: Self-pay | Admitting: Emergency Medicine

## 2019-08-06 ENCOUNTER — Other Ambulatory Visit: Payer: Self-pay

## 2019-08-06 DIAGNOSIS — M5432 Sciatica, left side: Secondary | ICD-10-CM | POA: Diagnosis not present

## 2019-08-06 DIAGNOSIS — M5431 Sciatica, right side: Secondary | ICD-10-CM | POA: Diagnosis not present

## 2019-08-06 DIAGNOSIS — Y9389 Activity, other specified: Secondary | ICD-10-CM | POA: Insufficient documentation

## 2019-08-06 DIAGNOSIS — M79604 Pain in right leg: Secondary | ICD-10-CM | POA: Diagnosis not present

## 2019-08-06 DIAGNOSIS — Z79899 Other long term (current) drug therapy: Secondary | ICD-10-CM | POA: Diagnosis not present

## 2019-08-06 DIAGNOSIS — Y998 Other external cause status: Secondary | ICD-10-CM | POA: Insufficient documentation

## 2019-08-06 DIAGNOSIS — Y92488 Other paved roadways as the place of occurrence of the external cause: Secondary | ICD-10-CM | POA: Diagnosis not present

## 2019-08-06 DIAGNOSIS — M545 Low back pain: Secondary | ICD-10-CM | POA: Diagnosis present

## 2019-08-06 DIAGNOSIS — E039 Hypothyroidism, unspecified: Secondary | ICD-10-CM | POA: Insufficient documentation

## 2019-08-06 DIAGNOSIS — M79605 Pain in left leg: Secondary | ICD-10-CM | POA: Insufficient documentation

## 2019-08-06 DIAGNOSIS — M7918 Myalgia, other site: Secondary | ICD-10-CM | POA: Diagnosis not present

## 2019-08-06 MED ORDER — CYCLOBENZAPRINE HCL 10 MG PO TABS: 10.0000 mg | ORAL_TABLET | Freq: Three times a day (TID) | ORAL | 0 refills | Status: DC | PRN

## 2019-08-06 MED ORDER — FLUORESCEIN SODIUM 1 MG OP STRP
1.0000 | ORAL_STRIP | Freq: Once | OPHTHALMIC | Status: AC
  Administered 2019-08-06: 1 via OPHTHALMIC
  Filled 2019-08-06: qty 1

## 2019-08-06 MED ORDER — TRAMADOL HCL 50 MG PO TABS: 50.0000 mg | ORAL_TABLET | Freq: Two times a day (BID) | ORAL | 0 refills | Status: AC | PRN

## 2019-08-06 MED ORDER — TETRACAINE HCL 0.5 % OP SOLN
2.0000 [drp] | Freq: Once | OPHTHALMIC | Status: AC
  Administered 2019-08-06: 2 [drp] via OPHTHALMIC
  Filled 2019-08-06: qty 4

## 2019-08-06 MED ORDER — EYE WASH OPHTH SOLN
1.0000 [drp] | OPHTHALMIC | Status: DC | PRN
  Filled 2019-08-06: qty 118

## 2019-08-06 MED ORDER — LIDOCAINE 5 % EX PTCH: 1.0000 | MEDICATED_PATCH | Freq: Two times a day (BID) | CUTANEOUS | 0 refills | Status: DC

## 2019-08-06 NOTE — Discharge Instructions (Signed)
Follow discharge care instruction take medication as directed. °
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
## 2019-08-08 ENCOUNTER — Ambulatory Visit: Payer: BC Managed Care – PPO | Admitting: Family Medicine

## 2019-08-15 ENCOUNTER — Other Ambulatory Visit: Payer: Self-pay

## 2019-08-15 ENCOUNTER — Ambulatory Visit (INDEPENDENT_AMBULATORY_CARE_PROVIDER_SITE_OTHER): Payer: BC Managed Care – PPO | Admitting: Family Medicine

## 2019-08-15 ENCOUNTER — Encounter: Payer: Self-pay | Admitting: Family Medicine

## 2019-08-15 VITALS — BP 115/77 | HR 89 | Temp 96.6°F | Wt 203.8 lb

## 2019-08-15 DIAGNOSIS — M5430 Sciatica, unspecified side: Secondary | ICD-10-CM | POA: Diagnosis not present

## 2019-08-15 DIAGNOSIS — L84 Corns and callosities: Secondary | ICD-10-CM | POA: Diagnosis not present

## 2019-08-15 DIAGNOSIS — M549 Dorsalgia, unspecified: Secondary | ICD-10-CM | POA: Diagnosis not present

## 2019-08-15 MED ORDER — IBUPROFEN 800 MG PO TABS: 800.0000 mg | ORAL_TABLET | Freq: Three times a day (TID) | ORAL | 5 refills | Status: AC | PRN
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
## 2019-08-20 ENCOUNTER — Other Ambulatory Visit: Payer: Self-pay | Admitting: Family Medicine

## 2019-09-28 ENCOUNTER — Other Ambulatory Visit: Payer: Self-pay

## 2019-09-28 ENCOUNTER — Ambulatory Visit: Payer: BC Managed Care – PPO | Attending: Internal Medicine

## 2019-09-28 DIAGNOSIS — Z23 Encounter for immunization: Secondary | ICD-10-CM

## 2019-09-28 NOTE — Progress Notes (Signed)
   Covid-19 Vaccination Clinic  Name:  Mikayla Munoz    MRN: [id number] DOB: August 11, 1955  09/28/2019  Mikayla Munoz was observed post Covid-19 immunization for 15 minutes without incident. She was provided with Vaccine Information Sheet and instruction to access the V-Safe system.   Mikayla Munoz was instructed to call 911 with any severe reactions post vaccine: Marland Kitchen Difficulty breathing  . Swelling of face and throat  . A fast heartbeat  . A bad rash all over body  . Dizziness and weakness   Immunizations Administered    Name Date Dose VIS Date Route   Pfizer COVID-19 Vaccine 09/28/2019  9:28 AM 0.3 mL 06/21/2019 Intramuscular   Manufacturer: [place]   Lot: [id number]   [place]: [id number]

## 2019-10-02 ENCOUNTER — Other Ambulatory Visit: Payer: Self-pay | Admitting: Family Medicine

## 2019-10-02 DIAGNOSIS — Z1231 Encounter for screening mammogram for malignant neoplasm of breast: Secondary | ICD-10-CM

## 2019-10-03 ENCOUNTER — Telehealth: Payer: Self-pay

## 2019-10-03 NOTE — Telephone Encounter (Signed)
Copied from [place] [id number]. Topic: General - Inquiry >> Oct 03, 2019 10:33 AM Mathis Bud wrote: Reason for CRM: Patient would like last visit be billed to her insurance, and also like the bill to be mailed to her [address]  [place] [address] Call back [phone]

## 2019-10-17 ENCOUNTER — Other Ambulatory Visit: Payer: Self-pay | Admitting: Dermatology

## 2019-10-23 ENCOUNTER — Ambulatory Visit: Payer: BC Managed Care – PPO | Attending: Internal Medicine

## 2019-10-23 DIAGNOSIS — Z23 Encounter for immunization: Secondary | ICD-10-CM

## 2019-10-23 NOTE — Progress Notes (Signed)
   Covid-19 Vaccination Clinic  Name:  Mikayla Munoz    MRN: [id number] DOB: 06-Feb-1956  10/23/2019  [name] was observed post Covid-19 immunization for 15 minutes without incident. She was provided with Vaccine Information Sheet and instruction to access the V-Safe system.   [name] was instructed to call 911 with any severe reactions post vaccine: Marland Kitchen Difficulty breathing  . Swelling of face and throat  . A fast heartbeat  . A bad rash all over body  . Dizziness and weakness   Immunizations Administered    Name Date Dose VIS Date Route   Pfizer COVID-19 Vaccine 10/23/2019  9:31 AM 0.3 mL 06/21/2019 Intramuscular   Manufacturer: [place]   Lot: [id number]   [place]: [id number]

## 2019-11-04 NOTE — Progress Notes (Signed)
Mikayla Munoz,acting as a scribe for Mikayla Durie, MD.,have documented all relevant documentation on the behalf of Mikayla Durie, MD,as directed by  Mikayla Durie, MD while in the presence of Mikayla Durie, MD.  Complete physical exam   Patient: Mikayla Munoz   DOB: 04/01/1956   64 y.o. Female  MRN: [id number] Visit Date: 11/07/2019  Today's healthcare provider: Wilhemena Durie, MD   Chief Complaint  Patient presents with  . Annual Exam   Subjective    Mikayla Munoz is a 64 y.o. female who presents today for a complete physical exam.  She reports consuming a general diet. Home exercise routine includes walking 4-5 times a week . She generally feels well. She reports sleeping poorly. She does have additional problems to discuss today.  She sees frequently Dr. Leonides Schanz from gynecology for well woman exam but Dr. Leonides Schanz is out for a while so we can perform this exam also. HPI   Patient would like to discuss hot flashes.  She is also having some problems with anxiety.  This is mild recently.  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  Mammogram- 12/23/19 Last colonoscopy- 04/27/2018   Past Medical History:  Diagnosis Date  . Complication of anesthesia    had to be admitted in 2010 after cone biopsy b/c unable to wake  . Hypothyroidism   . PONV (postoperative nausea and vomiting)    Past Surgical History:  Procedure Laterality Date  . BREAST BIOPSY Left 01/08/2019   stereo calcs, x-clip, benign  . BREAST BIOPSY Left 01/08/2019   stereo calcs, coil clip, ADH  . BREAST LUMPECTOMY Left 02/14/2019   ADH  . CARPAL TUNNEL RELEASE    . CERVICAL CONE BIOPSY    . CHOLECYSTECTOMY    . COLONOSCOPY WITH PROPOFOL N/A 04/27/2018   Procedure: COLONOSCOPY WITH PROPOFOL;  Surgeon: Lollie Sails, MD;  Location: [facility] ENDOSCOPY;  Service: Endoscopy;  Laterality: N/A;  . LASIK    . PARTIAL MASTECTOMY WITH NEEDLE LOCALIZATION Left 02/14/2019   Procedure: PARTIAL MASTECTOMY WITH NEEDLE LOCALIZATION, LEFT;  Surgeon: Benjamine Sprague, DO;  Location: [facility] ORS;  Service: General;  Laterality: Left;  . TONSILLECTOMY AND ADENOIDECTOMY     Social History   Socioeconomic History  . Marital status: Single    Spouse name: Not on file  . Number of children: Not on file  . Years of education: Not on file  . Highest education level: Not on file  Occupational History  . Not on file  Tobacco Use  . Smoking status: Never Smoker  . Smokeless tobacco: Never Used  Substance and Sexual Activity  . Alcohol use: Yes    Comment: Ocassionally  . Drug use: No  . Sexual activity: Not on file  Other Topics Concern  . Not on file  Social History Narrative  . Not on file   Social Determinants of Health   Financial Resource Strain:   . Difficulty of Paying Living Expenses:   Food Insecurity:   . Worried About Charity fundraiser in the Last Year:   . Arboriculturist in the Last Year:   Transportation Needs:   . Film/video editor (Medical):   Marland Kitchen Lack of Transportation (Non-Medical):   Physical Activity:   . Days of Exercise per Week:   . Minutes of Exercise per Session:   Stress:   . Feeling of Stress :   Social Connections:   .  Frequency of Communication with Friends and Family:   . Frequency of Social Gatherings with Friends and Family:   . Attends Religious Services:   . Active Member of Clubs or Organizations:   . Attends Archivist Meetings:   Marland Kitchen Marital Status:   Intimate Partner Violence:   . Fear of Current or Ex-Partner:   . Emotionally Abused:   Marland Kitchen Physically Abused:   . Sexually Abused:    Family Status  Relation Name Status  . Mother  Deceased  . Father  Deceased  . Sister  Deceased  . Sister  Deceased  . Mat Uncle  (Not Specified)  . MGM  (Not Specified)  . MGF  (Not Specified)  . PGM  (Not Specified)  . PGF  (Not Specified)  . Neg Hx  (Not Specified)   Family History  Problem Relation Age of Onset    . Diabetes Mother   . Hypertension Mother   . Depression Mother   . Asthma Mother   . COPD Mother   . Macular degeneration Mother   . Heart disease Father   . Heart attack Father   . Lung cancer Father   . Aneurysm Father   . Cancer - Lung Father   . Cirrhosis Sister   . Alcohol abuse Sister   . Diabetes Maternal Uncle   . Heart disease Maternal Uncle   . Uterine cancer Maternal Grandmother   . Throat cancer Maternal Grandfather   . Arthritis Paternal Grandmother   . Stroke Paternal Grandmother   . Heart attack Paternal Grandfather   . Breast cancer Neg Hx    Allergies  Allergen Reactions  . Codeine Nausea And Vomiting  . Expectorant Cough Control [Guaifenesin] Nausea And Vomiting    Patient Care Team: Jerrol Banana., MD as PCP - General (Family Medicine)   Medications: Outpatient Medications Prior to Visit  Medication Sig  . Biotin 10 MG CAPS Take 10 mg by mouth daily.  . Brimonidine Tartrate (LUMIFY) 0.025 % SOLN Place 1 drop into both eyes daily as needed (for allergies).  . Cholecalciferol (VITAMIN D) 2000 UNITS tablet Take 2,000 Units by mouth daily.   . clobetasol cream (TEMOVATE) 0.05 % Apply topically as directed. Qd to bid aa rash on back until clear, avoid face, groin, axilla  . fluticasone (FLONASE) 50 MCG/ACT nasal spray Place 1-2 sprays into both nostrils daily as needed for allergies or rhinitis.  Marland Kitchen loratadine (CLARITIN) 10 MG tablet Take 10 mg by mouth daily.   . Lutein-Zeaxanthin 25-5 MG CAPS Take 1 capsule by mouth daily.   . rosuvastatin (CRESTOR) 5 MG tablet Take 1 tablet (5 mg total) by mouth at bedtime.  Marland Kitchen SYNTHROID 50 MCG tablet Take 1 tablet by mouth once daily  . valACYclovir (VALTREX) 500 MG tablet Take 1 tablet by mouth twice daily  . cyclobenzaprine (FLEXERIL) 10 MG tablet Take 1 tablet (10 mg total) by mouth 3 (three) times daily as needed. (Patient not taking: Reported on 11/07/2019)  . ibuprofen (ADVIL) 800 MG tablet Take 1 tablet  (800 mg total) by mouth every 8 (eight) hours as needed for mild pain or moderate pain. (Patient not taking: Reported on 11/07/2019)  . lidocaine (LIDODERM) 5 % Place 1 patch onto the skin every 12 (twelve) hours. Remove & Discard patch within 12 hours or as directed by MD (Patient not taking: Reported on 08/15/2019)  . traMADol (ULTRAM) 50 MG tablet Take 1 tablet (50 mg total) by mouth every  6 (six) hours as needed for severe pain. (Patient not taking: Reported on 11/07/2019)   No facility-administered medications prior to visit.    Review of Systems  Constitutional: Negative.   HENT: Negative.   Eyes: Negative.   Respiratory: Negative.   Cardiovascular: Negative.   Gastrointestinal: Negative.   Endocrine: Negative.        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  Genitourinary: Negative.   Musculoskeletal: Positive for back pain.       Leg pain  Skin: Positive for rash.  Allergic/Immunologic: Positive for environmental allergies.  Neurological: Negative.   Hematological: Negative.   Psychiatric/Behavioral: The patient is nervous/anxious.        Objective    BP 113/76 (BP Location: Right Arm, Patient Position: Sitting, Cuff Size: Large)   Pulse 90   Temp (!) 97.1 F (36.2 C) (Temporal)   Ht 5\' 5"  (1.651 m)   Wt 209 lb 3.2 oz (94.9 kg)   BMI 34.81 kg/m  BP Readings from Last 3 Encounters:  11/07/19 113/76  08/15/19 115/77  08/06/19 109/89   Wt Readings from Last 3 Encounters:  11/07/19 209 lb 3.2 oz (94.9 kg)  08/15/19 203 lb 12.8 oz (92.4 kg)  08/06/19 200 lb (90.7 kg)      Physical Exam Vitals reviewed.  Constitutional:      General: She is not in acute distress.    Appearance: She is well-developed. She is obese.  HENT:     Head: Normocephalic and atraumatic.     Right Ear: Hearing, tympanic membrane and ear canal normal.     Left Ear: Hearing, tympanic membrane and ear canal normal.      Nose: Nose normal.     Mouth/Throat:     Pharynx: Oropharynx is clear.  Eyes:     General: Lids are normal. No scleral icterus.       Right eye: No discharge.        Left eye: No discharge.     Conjunctiva/sclera: Conjunctivae normal.  Cardiovascular:     Rate and Rhythm: Normal rate and regular rhythm.     Heart sounds: Normal heart sounds.  Pulmonary:     Effort: Pulmonary effort is normal. No respiratory distress.     Breath sounds: Normal breath sounds. No wheezing, rhonchi or rales.  Abdominal:     Palpations: Abdomen is soft.  Genitourinary:    General: Normal vulva.     Comments: Normal bimanual exam.  DRE deferred as she had colonoscopy a year and a half ago. Musculoskeletal:        General: Normal range of motion.  Skin:    General: Skin is warm and dry.     Findings: No lesion or rash.     Comments: Small corn on left lateral foot just behind/inferior to the MTP joint of fifth toe.  Neurological:     General: No focal deficit present.     Mental Status: She is alert and oriented to person, place, and time.  Psychiatric:        Mood and Affect: Mood normal.        Speech: Speech normal.        Behavior: Behavior normal.        Thought Content: Thought content normal.        Judgment: Judgment normal.       Depression Screen  PHQ 2/9 Scores 10/09/2017 01/25/2017  PHQ -  2 Score 0 0  PHQ- 9 Score 2 2    No results found for any visits on 11/07/19.  Assessment & Plan    Routine Health Maintenance and Physical Exam  Exercise Activities and Dietary recommendations Goals   None     Immunization History  Administered Date(s) Administered  . Hepatitis B 03/27/1997, 04/24/1997, 09/25/1997  . PFIZER SARS-COV-2 Vaccination 09/28/2019, 10/23/2019  . Td 01/25/2017  . Tdap 11/16/2006  . Zoster Recombinat (Shingrix) 02/03/2018, 05/31/2018    Health Maintenance  Topic Date Due  . HIV Screening  Never done  . PAP SMEAR-Modifier  10/01/2019  . INFLUENZA VACCINE   02/09/2020  . MAMMOGRAM  12/20/2020  . COLONOSCOPY  04/28/2023  . TETANUS/TDAP  01/26/2027  . COVID-19 Vaccine  Completed  . Hepatitis C Screening  Completed    Discussed health benefits of physical activity, and encouraged her to engage in regular exercise appropriate for her age and condition. 1. Annual physical exam Normal Pap with HPV in March 2018.  Repeat in 2023 and that will be last Pap smear for patient.  2. Adult hypothyroidism  - TSH - Lipid panel - CBC with Differential/Platelet - Comprehensive metabolic panel  3. Hypercholesteremia  - TSH - Lipid panel - CBC with Differential/Platelet - Comprehensive metabolic panel  4. Anxiety  - TSH - Lipid panel - CBC with Differential/Platelet - Comprehensive metabolic panel - venlafaxine XR (EFFEXOR XR) 37.5 MG 24 hr capsule; Take 1 capsule (37.5 mg total) by mouth daily with breakfast.  Dispense: 90 capsule; Refill: 3  5. Hot flashes 5 venlafaxine.  We will start with low-dose. - TSH - Lipid panel - CBC with Differential/Platelet - Comprehensive metabolic panel - venlafaxine XR (EFFEXOR XR) 37.5 MG 24 hr capsule; Take 1 capsule (37.5 mg total) by mouth daily with breakfast.  Dispense: 90 capsule; Refill: 3  6. Postmenopausal Unable to take hormone therapy due to breast changes/DCIS. - TSH - Lipid panel - CBC with Differential/Platelet - Comprehensive metabolic panel - venlafaxine XR (EFFEXOR XR) 37.5 MG 24 hr capsule; Take 1 capsule (37.5 mg total) by mouth daily with breakfast.  Dispense: 90 capsule; Refill: 3 - POCT Wet [facility])   No follow-ups on file.     I, Mikayla Durie, MD, have reviewed all documentation for this visit. The documentation on 11/10/19 for the exam, diagnosis, procedures, and orders are all accurate and complete.    Mikayla Willmott Cranford Mon, MD  [facility] [phone] (phone) [phone] (fax)  [place]

## 2019-11-07 ENCOUNTER — Encounter: Payer: Self-pay | Admitting: Family Medicine

## 2019-11-07 ENCOUNTER — Other Ambulatory Visit: Payer: Self-pay

## 2019-11-07 ENCOUNTER — Ambulatory Visit (INDEPENDENT_AMBULATORY_CARE_PROVIDER_SITE_OTHER): Payer: BC Managed Care – PPO | Admitting: Family Medicine

## 2019-11-07 VITALS — BP 113/76 | HR 90 | Temp 97.1°F | Ht 65.0 in | Wt 209.2 lb

## 2019-11-07 DIAGNOSIS — Z0000 Encounter for general adult medical examination without abnormal findings: Secondary | ICD-10-CM | POA: Diagnosis not present

## 2019-11-07 DIAGNOSIS — F419 Anxiety disorder, unspecified: Secondary | ICD-10-CM | POA: Diagnosis not present

## 2019-11-07 DIAGNOSIS — E7800 Pure hypercholesterolemia, unspecified: Secondary | ICD-10-CM | POA: Diagnosis not present

## 2019-11-07 DIAGNOSIS — Z780 Asymptomatic menopausal state: Secondary | ICD-10-CM

## 2019-11-07 DIAGNOSIS — R232 Flushing: Secondary | ICD-10-CM

## 2019-11-07 DIAGNOSIS — E039 Hypothyroidism, unspecified: Secondary | ICD-10-CM | POA: Diagnosis not present

## 2019-11-07 LAB — POCT WET PREP (WET MOUNT)

## 2019-11-07 MED ORDER — VENLAFAXINE HCL ER 37.5 MG PO CP24: 37.5000 mg | ORAL_CAPSULE | Freq: Every day | ORAL | 3 refills | Status: DC

## 2019-11-09 LAB — COMPREHENSIVE METABOLIC PANEL
ALT: 28 IU/L (ref 0–32)
AST: 22 IU/L (ref 0–40)
Albumin/Globulin Ratio: 1.8 (ref 1.2–2.2)
Albumin: 4.5 g/dL (ref 3.8–4.8)
Alkaline Phosphatase: 115 IU/L (ref 39–117)
BUN/Creatinine Ratio: 13 (ref 12–28)
BUN: 10 mg/dL (ref 8–27)
Bilirubin Total: 0.4 mg/dL (ref 0.0–1.2)
CO2: 20 mmol/L (ref 20–29)
Calcium: 9.8 mg/dL (ref 8.7–10.3)
Chloride: 102 mmol/L (ref 96–106)
Creatinine, Ser: 0.8 mg/dL (ref 0.57–1.00)
GFR calc Af Amer: 91 mL/min/{1.73_m2} (ref >59)
GFR calc non Af Amer: 79 mL/min/{1.73_m2} (ref >59)
Globulin, Total: 2.5 g/dL (ref 1.5–4.5)
Glucose: 96 mg/dL (ref 65–99)
Potassium: 4.7 mmol/L (ref 3.5–5.2)
Sodium: 141 mmol/L (ref 134–144)
Total Protein: 7 g/dL (ref 6.0–8.5)

## 2019-11-09 LAB — TSH: TSH: 3.36 u[IU]/mL (ref 0.450–4.500)

## 2019-11-09 LAB — CBC WITH DIFFERENTIAL/PLATELET
Basophils Absolute: 0.1 10*3/uL (ref 0.0–0.2)
Basos: 1 %
EOS (ABSOLUTE): 0.2 10*3/uL (ref 0.0–0.4)
Eos: 4 %
Hematocrit: 42.1 % (ref 34.0–46.6)
Hemoglobin: 14.2 g/dL (ref 11.1–15.9)
Immature Grans (Abs): 0 10*3/uL (ref 0.0–0.1)
Immature Granulocytes: 0 %
Lymphocytes Absolute: 1.4 10*3/uL (ref 0.7–3.1)
Lymphs: 22 %
MCH: 30.1 pg (ref 26.6–33.0)
MCHC: 33.7 g/dL (ref 31.5–35.7)
MCV: 89 fL (ref 79–97)
Monocytes Absolute: 0.5 10*3/uL (ref 0.1–0.9)
Monocytes: 8 %
Neutrophils Absolute: 4.1 10*3/uL (ref 1.4–7.0)
Neutrophils: 65 %
Platelets: 343 10*3/uL (ref 150–450)
RBC: 4.72 x10E6/uL (ref 3.77–5.28)
RDW: 12.5 % (ref 11.7–15.4)
WBC: 6.4 10*3/uL (ref 3.4–10.8)

## 2019-11-09 LAB — LIPID PANEL
Chol/HDL Ratio: 2.6 ratio (ref 0.0–4.4)
Cholesterol, Total: 162 mg/dL (ref 100–199)
HDL: 62 mg/dL (ref >39)
LDL Chol Calc (NIH): 82 mg/dL (ref 0–99)
Triglycerides: 102 mg/dL (ref 0–149)
VLDL Cholesterol Cal: 18 mg/dL (ref 5–40)

## 2019-11-14 ENCOUNTER — Telehealth: Payer: Self-pay

## 2019-11-14 NOTE — Telephone Encounter (Signed)
-----   Message from Jerrol Banana., MD sent at 11/14/2019 10:49 AM EDT ----- Labs in normal range.

## 2019-11-14 NOTE — Telephone Encounter (Signed)
Patient advised.

## 2019-12-06 ENCOUNTER — Encounter (INDEPENDENT_AMBULATORY_CARE_PROVIDER_SITE_OTHER): Payer: Self-pay | Admitting: Ophthalmology

## 2019-12-06 ENCOUNTER — Ambulatory Visit (INDEPENDENT_AMBULATORY_CARE_PROVIDER_SITE_OTHER): Payer: BC Managed Care – PPO | Admitting: Ophthalmology

## 2019-12-06 ENCOUNTER — Other Ambulatory Visit: Payer: Self-pay

## 2019-12-06 DIAGNOSIS — H35033 Hypertensive retinopathy, bilateral: Secondary | ICD-10-CM | POA: Diagnosis not present

## 2019-12-06 DIAGNOSIS — H43811 Vitreous degeneration, right eye: Secondary | ICD-10-CM | POA: Diagnosis not present

## 2019-12-06 DIAGNOSIS — H3581 Retinal edema: Secondary | ICD-10-CM | POA: Diagnosis not present

## 2019-12-06 DIAGNOSIS — I10 Essential (primary) hypertension: Secondary | ICD-10-CM

## 2019-12-06 DIAGNOSIS — Z8669 Personal history of other diseases of the nervous system and sense organs: Secondary | ICD-10-CM

## 2019-12-06 DIAGNOSIS — Z961 Presence of intraocular lens: Secondary | ICD-10-CM

## 2019-12-06 DIAGNOSIS — Z9889 Other specified postprocedural states: Secondary | ICD-10-CM

## 2019-12-06 NOTE — Progress Notes (Signed)
[facility] Note  12/06/2019     CHIEF COMPLAINT Patient presents for Retina Evaluation   HISTORY OF PRESENT ILLNESS: Mikayla Munoz is a 64 y.o. female who presents to the clinic today for:   HPI    Retina Evaluation    In right eye.  This started 4 days ago.  Duration of 4 days.  Associated Symptoms Distortion, Floaters, Glare and Photophobia.  Context:  distance vision, mid-range vision and near vision.  Treatments tried include artificial tears.  Response to treatment was no improvement.  I, the attending physician,  performed the HPI with the patient and updated documentation appropriately.          Comments    64 y/o female pt here c/o milky vision/temporal floaters OD x 4 days.  Pt of Dr. Ellin Mayhew.  Symptoms sudden onset, constant, and nothing seems to make them better.  Also has been having a constant headache.  No problems reported OS.  Denies eye pain, FOL.  AT prn OU.       Last edited by Bernarda Caffey, MD on 12/08/2019  5:51 PM. (History)    pt is here on the referral of Dr. Ellin Mayhew for concern of floaters and milky vision, pt states Dr. Ellin Mayhew did not see her yesterday, but suggested she come here instead, pt states 2 days ago she had a "horrible" day of floaters, she states yesterday she had a line going across her vision every time she blinked, she states she has had a headache for 4 days, she feels like she is looking through smoke or a black cloud, she has hx of lasik and cataract sx, pt has fam hx of macular degeneration  Referring physician: Anell Barr, OD [place],  [place] [address]  HISTORICAL INFORMATION:   Selected notes from the MEDICAL RECORD NUMBER Referred by Dr. Ellin Mayhew for new onset floaters OD   CURRENT MEDICATIONS: Current Outpatient Medications (Ophthalmic Drugs)  Medication Sig  . Brimonidine Tartrate (LUMIFY) 0.025 % SOLN Place 1 drop into both eyes daily as needed (for allergies).   No current  facility-administered medications for this visit. (Ophthalmic Drugs)   Current Outpatient Medications (Other)  Medication Sig  . Biotin 10 MG CAPS Take 10 mg by mouth daily.  . Cholecalciferol (VITAMIN D) 2000 UNITS tablet Take 2,000 Units by mouth daily.   . clobetasol cream (TEMOVATE) 0.05 % Apply topically as directed. Qd to bid aa rash on back until clear, avoid face, groin, axilla  . cyclobenzaprine (FLEXERIL) 10 MG tablet Take 1 tablet (10 mg total) by mouth 3 (three) times daily as needed.  Marland Kitchen FINACEA 15 % FOAM Apply A SMALL AMOUNT TO SKIN TWICE A DAY  . fluticasone (FLONASE) 50 MCG/ACT nasal spray Place 1-2 sprays into both nostrils daily as needed for allergies or rhinitis.  Marland Kitchen ibuprofen (ADVIL) 800 MG tablet Take 1 tablet (800 mg total) by mouth every 8 (eight) hours as needed for mild pain or moderate pain.  Marland Kitchen lidocaine (LIDODERM) 5 % Place 1 patch onto the skin every 12 (twelve) hours. Remove & Discard patch within 12 hours or as directed by MD  . loratadine (CLARITIN) 10 MG tablet Take 10 mg by mouth daily.   . Lutein 10 MG TABS Take by mouth.  . Lutein-Zeaxanthin 25-5 MG CAPS Take 1 capsule by mouth daily.   . rosuvastatin (CRESTOR) 5 MG tablet Take 1 tablet (5 mg total) by mouth at bedtime.  Marland Kitchen  SYNTHROID 50 MCG tablet Take 1 tablet by mouth once daily  . traMADol (ULTRAM) 50 MG tablet Take 1 tablet (50 mg total) by mouth every 6 (six) hours as needed for severe pain.  Marland Kitchen triamcinolone (NASACORT) 55 MCG/ACT AERO nasal inhaler Place into the nose.  . valACYclovir (VALTREX) 500 MG tablet Take 1 tablet by mouth twice daily  . venlafaxine XR (EFFEXOR XR) 37.5 MG 24 hr capsule Take 1 capsule (37.5 mg total) by mouth daily with breakfast.   No current facility-administered medications for this visit. (Other)      REVIEW OF SYSTEMS: ROS    Positive for: Musculoskeletal, Eyes   Negative for: Constitutional, Gastrointestinal, Neurological, Skin, Genitourinary, HENT, Endocrine,  Cardiovascular, Respiratory, Psychiatric, Allergic/Imm, Heme/Lymph   Last edited by Matthew Folks, COA on 12/06/2019  8:39 AM. (History)       ALLERGIES Allergies  Allergen Reactions  . Codeine Nausea And Vomiting  . Expectorant Cough Control [Guaifenesin] Nausea And Vomiting    PAST MEDICAL HISTORY Past Medical History:  Diagnosis Date  . Complication of anesthesia    had to be admitted in 2010 after cone biopsy b/c unable to wake  . Hypothyroidism   . PONV (postoperative nausea and vomiting)    Past Surgical History:  Procedure Laterality Date  . BREAST BIOPSY Left 01/08/2019   stereo calcs, x-clip, benign  . BREAST BIOPSY Left 01/08/2019   stereo calcs, coil clip, ADH  . BREAST LUMPECTOMY Left 02/14/2019   ADH  . CARPAL TUNNEL RELEASE    . CATARACT EXTRACTION Bilateral 05/2019   Dr. Lyndel Safe  . CERVICAL CONE BIOPSY    . CHOLECYSTECTOMY    . COLONOSCOPY WITH PROPOFOL N/A 04/27/2018   Procedure: COLONOSCOPY WITH PROPOFOL;  Surgeon: Lollie Sails, MD;  Location[facility] END[facility]COPY;  Service: Endoscopy;  Laterality: N/A;  . EYE SURGERY Bilateral 05/2019   Cat Sx - Dr. Lyndel Safe  . EYE SURGERY Bilateral 2004   LASIK  . LASIK Bilateral 2004  . PARTIAL MASTECTOMY WITH NEEDLE LOCALIZATION Left 02/14/2019   Procedure: PARTIAL MASTECTOMY WITH NEEDLE LOCALIZATION, LEFT;  Surgeon: Benjamine Sprague, DO;  Location: [facility] ORS;  Service: General;  Laterality: Left;  . TONSILLECTOMY AND ADENOIDECTOMY      FAMILY HISTORY Family History  Problem Relation Age of Onset  . Diabetes Mother   . Hypertension Mother   . Depression Mother   . Asthma Mother   . COPD Mother   . Macular degeneration Mother   . Heart disease Father   . Heart attack Father   . Lung cancer Father   . Aneurysm Father   . Cancer - Lung Father   . Cirrhosis Sister   . Alcohol abuse Sister   . Diabetes Maternal Uncle   . Heart disease Maternal Uncle   . Uterine cancer Maternal Grandmother   . Throat cancer  Maternal Grandfather   . Arthritis Paternal Grandmother   . Stroke Paternal Grandmother   . Heart attack Paternal Grandfather   . Breast cancer Neg Hx     SOCIAL HISTORY Social History   Tobacco Use  . Smoking status: Never Smoker  . Smokeless tobacco: Never Used  Substance Use Topics  . Alcohol use: Yes    Comment: Ocassionally  . Drug use: No         OPHTHALMIC EXAM:  Base Eye Exam    Visual Acuity (Snellen - Linear)      Right Left   Dist [place] 20/30 -2  Dist ph [place] 20/20 -2    Near   20/25-  [place] [facility]       Tonometry (Tonopen, 8:43 AM)      Right Left   Pressure 12 12       Pupils      Dark Light Shape React APD   Right 4 3 Round Brisk None   Left 4 3 Round Brisk None       Visual Fields (Counting fingers)      Left Right    Full Full       Extraocular Movement      Right Left    Full, Ortho Full, Ortho       Neuro/Psych    Oriented x3: Yes   Mood/Affect: Normal       Dilation    Both eyes: 1.0% Mydriacyl, 2.5% Phenylephrine @ 8:43 AM        Slit Lamp and Fundus Exam    Slit Lamp Exam      Right Left   Lids/Lashes Dermatochalasis - upper lid Dermatochalasis - upper lid   Conjunctiva/Sclera White and quiet White and quiet   Cornea well healed temporal cataract wounds, trace Debris in tear film, barely visible lasik flap well healed temporal cataract wounds, trace Debris in tear film, barely lasik flap   Anterior Chamber Deep and quiet Deep and quiet   Iris Round and dilated Round and dilated   Lens PC IOL in good position, trace PC folds PC IOL in good position, trace PC folds   Vitreous Vitreous syneresis, Posterior vitreous detachment, Weiss ring Vitreous syneresis, vitreous condensations, no frank PVD       Fundus Exam      Right Left   Disc Anomylous, severe tilt, temporal PPA, punctate heme at 0200 Anomylous, severe tilt, inferior PPA   C/D Ratio flat flat   Macula Flat, Blunted foveal reflex, RPE mottling and clumping, No heme or  edema Flat, Blunted foveal reflex, RPE mottling and clumping, No heme or edema   Vessels Vascular attenuation, Tortuous Vascular attenuation, mild Tortuousity   Periphery Attached, blonde peripheral fundus, inferior paving stone, peripheral, pigmented cystoid degeneration, No RT/RD on 360 scleral depression  Attached           Refraction    Manifest Refraction      Sphere Cylinder Axis Dist [facility]   Right -0.75 +0.75 110 20/20-   Left -2.00 +0.25 100 20/20-          IMAGING AND PROCEDURES  Imaging and Procedures for @TODAY @  OCT, Retina - OU - Both Eyes       Right Eye Quality was good. Central Foveal Thickness: 305. Progression has no prior data. Findings include normal foveal contour, no IRF, no SRF, myopic contour (Trace vitreous opacities).   Left Eye Quality was good. Central Foveal Thickness: 309. Progression has no prior data. Findings include normal foveal contour, no IRF, no SRF, myopic contour, vitreomacular adhesion .   Notes *Images captured and stored on drive  Diagnosis / Impression:  NFP, no IRF/SRF OU OD: tr vit opacities  Clinical management:  See below  Abbreviations: NFP - Normal foveal profile. CME - cystoid macular edema. PED - pigment epithelial detachment. IRF - intraretinal fluid. SRF - subretinal fluid. EZ - ellipsoid zone. ERM - epiretinal membrane. ORA - outer retinal atrophy. ORT - outer retinal tubulation. SRHM - subretinal hyper-reflective material  ASSESSMENT/PLAN:    ICD-10-CM   1. Posterior vitreous detachment of right eye  H43.811   2. Retinal edema  H35.81 OCT, Retina - OU - Both Eyes  3. Essential hypertension  I10   4. Hypertensive retinopathy of both eyes  H35.033   5. Pseudophakia of both eyes  Z96.1   6. History of myopia  Z86.69   7. Hx of LASIK  Z98.890     1,2. PVD / vitreous syneresis OD  - acute symptomatic floaters OD x4 days, no photopsias  - Discussed findings and prognosis  - No RT or RD on  360 scleral depressed exam  - Reviewed s/s of RT/RD  - Strict return precautions for any such RT/RD signs/symptoms  - return 4 weeks, sooner prn -- DFE, OCT  3,4. Hypertensive retinopathy OU  - discussed importance of tight BP control  - monitor  5. Pseudophakia OU  - s/p CE/IOL OU (Dr. Lyndel Safe, 2020)  - beautiful surgeries, doing well  - monitor  6,7. Hx of Severe myopia s/p lasik OU  - Dr. Isaiah Blakes ([place])  - discussed association of high myopia with lattice degeneration and increased risk of RT/RD      Ophthalmic Meds Ordered this visit:  No orders of the defined types were placed in this encounter.      Return in about 4 weeks (around 01/03/2020) for f/u PVD OD, DFE, OCT.  There are no Patient Instructions on file for this visit.   Explained the diagnoses, plan, and follow up with the patient and they expressed understanding.  Patient expressed understanding of the importance of proper follow up care.   This document serves as a record of services personally performed by Gardiner Sleeper, MD, PhD. It was created on their behalf by Ernest Mallick, OA, an ophthalmic assistant. The creation of this record is the provider's dictation and/or activities during the visit.    Electronically signed by: Ernest Mallick, OA 05.28.2021 6:00 PM   Gardiner Sleeper, M.D., Ph.D. Diseases & Surgery of the Retina and Vitreous [facility]  I have reviewed the above documentation for accuracy and completeness, and I agree with the above. Gardiner Sleeper, M.D., Ph.D. 12/08/19 6:00 PM    Abbreviations: M myopia (nearsighted); A astigmatism; H hyperopia (farsighted); P presbyopia; Mrx spectacle prescription;  CTL contact lenses; OD right eye; OS left eye; OU both eyes  XT exotropia; ET esotropia; PEK punctate epithelial keratitis; PEE punctate epithelial erosions; DES dry eye syndrome; MGD meibomian gland dysfunction; ATs artificial tears; PFAT's preservative free artificial  tears; [place] nuclear sclerotic cataract; PSC posterior subcapsular cataract; ERM epi-retinal membrane; PVD posterior vitreous detachment; RD retinal detachment; DM diabetes mellitus; DR diabetic retinopathy; NPDR non-proliferative diabetic retinopathy; PDR proliferative diabetic retinopathy; CSME clinically significant macular edema; DME diabetic macular edema; dbh dot blot hemorrhages; CWS cotton wool spot; POAG primary open angle glaucoma; C/D cup-to-disc ratio; HVF humphrey visual field; GVF goldmann visual field; OCT optical coherence tomography; IOP intraocular pressure; BRVO Branch retinal vein occlusion; CRVO central retinal vein occlusion; CRAO central retinal artery occlusion; BRAO branch retinal artery occlusion; RT retinal tear; SB scleral buckle; PPV pars plana vitrectomy; VH Vitreous hemorrhage; PRP panretinal laser photocoagulation; IVK intravitreal kenalog; VMT vitreomacular traction; MH Macular hole;  NVD neovascularization of the disc; NVE neovascularization elsewhere; AREDS age related eye disease study; ARMD age related macular degeneration; POAG primary open angle glaucoma; EBMD epithelial/anterior basement membrane dystrophy; ACIOL anterior chamber intraocular lens; IOL intraocular lens; PCIOL  posterior chamber intraocular lens; Phaco/IOL phacoemulsification with intraocular lens placement; [place] photorefractive keratectomy; LASIK laser assisted in situ keratomileusis; HTN hypertension; DM diabetes mellitus; COPD chronic obstructive pulmonary disease

## 2019-12-08 ENCOUNTER — Encounter (INDEPENDENT_AMBULATORY_CARE_PROVIDER_SITE_OTHER): Payer: Self-pay | Admitting: Ophthalmology

## 2019-12-23 ENCOUNTER — Ambulatory Visit
Admission: RE | Admit: 2019-12-23 | Discharge: 2019-12-23 | Disposition: A | Discharge status: Home / Self Care | Payer: BC Managed Care – PPO | Source: Ambulatory Visit | Attending: Family Medicine | Admitting: Family Medicine

## 2019-12-23 DIAGNOSIS — Z1231 Encounter for screening mammogram for malignant neoplasm of breast: Secondary | ICD-10-CM | POA: Insufficient documentation

## 2019-12-30 ENCOUNTER — Ambulatory Visit: Payer: Self-pay | Admitting: Family Medicine

## 2020-01-07 ENCOUNTER — Ambulatory Visit: Payer: Self-pay | Admitting: Family Medicine

## 2020-01-07 NOTE — Progress Notes (Signed)
[facility] Note  01/09/2020     CHIEF COMPLAINT Patient presents for Retina Follow Up   HISTORY OF PRESENT ILLNESS: Mikayla Munoz is a 64 y.o. female who presents to the clinic today for:   HPI    Retina Follow Up    Patient presents with  PVD.  In right eye.  This started weeks ago.  Severity is moderate.  Duration of weeks.  Since onset it is stable.  I, the attending physician,  performed the HPI with the patient and updated documentation appropriately.          Comments    Pt states vision is the same OU.  Pt states floaters are still present OD--no new floaters or flashes of light.  Pt denies any new or worsening floaters or fol OS.       Last edited by Bernarda Caffey, MD on 01/09/2020  9:39 AM. (History)    pt states floaters are still there, she says sometimes they settle off to the side and other days they are in her vision bothering her, she has an appt with Dr. Ellin Mayhew next week   Referring physician: Anell Barr, OD [place],  [place] [address]  HISTORICAL INFORMATION:   Selected notes from the [facility] Referred by Dr. Ellin Mayhew for new onset floaters OD   CURRENT MEDICATIONS: Current Outpatient Medications (Ophthalmic Drugs)  Medication Sig  . Brimonidine Tartrate (LUMIFY) 0.025 % SOLN Place 1 drop into both eyes daily as needed (for allergies).   No current facility-administered medications for this visit. (Ophthalmic Drugs)   Current Outpatient Medications (Other)  Medication Sig  . Biotin 10 MG CAPS Take 10 mg by mouth daily.  . Cholecalciferol (VITAMIN D) 2000 UNITS tablet Take 2,000 Units by mouth daily.   . clobetasol cream (TEMOVATE) 0.05 % Apply topically as directed. Qd to bid aa rash on back until clear, avoid face, groin, axilla  . cyclobenzaprine (FLEXERIL) 10 MG tablet Take 1 tablet (10 mg total) by mouth 3 (three) times daily as needed.  Marland Kitchen FINACEA 15 % FOAM Apply A SMALL AMOUNT TO SKIN TWICE A  DAY  . fluticasone (FLONASE) 50 MCG/ACT nasal spray Place 1-2 sprays into both nostrils daily as needed for allergies or rhinitis.  Marland Kitchen ibuprofen (ADVIL) 800 MG tablet Take 1 tablet (800 mg total) by mouth every 8 (eight) hours as needed for mild pain or moderate pain.  Marland Kitchen lidocaine (LIDODERM) 5 % Place 1 patch onto the skin every 12 (twelve) hours. Remove & Discard patch within 12 hours or as directed by MD  . loratadine (CLARITIN) 10 MG tablet Take 10 mg by mouth daily.   . Lutein 10 MG TABS Take by mouth.  . Lutein-Zeaxanthin 25-5 MG CAPS Take 1 capsule by mouth daily.   . rosuvastatin (CRESTOR) 5 MG tablet Take 1 tablet (5 mg total) by mouth at bedtime.  Marland Kitchen SYNTHROID 50 MCG tablet Take 1 tablet by mouth once daily  . traMADol (ULTRAM) 50 MG tablet Take 1 tablet (50 mg total) by mouth every 6 (six) hours as needed for severe pain.  Marland Kitchen triamcinolone (NASACORT) 55 MCG/ACT AERO nasal inhaler Place into the nose.  . valACYclovir (VALTREX) 500 MG tablet Take 1 tablet by mouth twice daily  . venlafaxine XR (EFFEXOR XR) 37.5 MG 24 hr capsule Take 1 capsule (37.5 mg total) by mouth daily with breakfast.   No current facility-administered medications for this visit. (  Other)      REVIEW OF SYSTEMS: ROS    Positive for: Musculoskeletal, Eyes   Negative for: Constitutional, Gastrointestinal, Neurological, Skin, Genitourinary, HENT, Endocrine, Cardiovascular, Respiratory, Psychiatric, Allergic/Imm, Heme/Lymph   Last edited by Doneen Poisson on 01/09/2020  8:47 AM. (History)       ALLERGIES Allergies  Allergen Reactions  . Codeine Nausea And Vomiting  . Expectorant Cough Control [Guaifenesin] Nausea And Vomiting    PAST MEDICAL HISTORY Past Medical History:  Diagnosis Date  . Complication of anesthesia    had to be admitted in 2010 after cone biopsy b/c unable to wake  . Hypothyroidism   . PONV (postoperative nausea and vomiting)    Past Surgical History:  Procedure Laterality Date  .  BREAST BIOPSY Left 01/08/2019   stereo calcs, x-clip, benign  . BREAST BIOPSY Left 01/08/2019   stereo calcs, coil clip, ADH  . BREAST LUMPECTOMY Left 02/14/2019   ADH  . CARPAL TUNNEL RELEASE    . CATARACT EXTRACTION Bilateral 05/2019   Dr. Lyndel Safe  . CERVICAL CONE BIOPSY    . CHOLECYSTECTOMY    . COLONOSCOPY WITH PROPOFOL N/A 04/27/2018   Procedure: COLONOSCOPY WITH PROPOFOL;  Surgeon: Lollie Sails, MD;  Location: [facility] ENDOSCOPY;  Service: Endoscopy;  Laterality: N/A;  . EYE SURGERY Bilateral 05/2019   Cat Sx - Dr. Lyndel Safe  . EYE SURGERY Bilateral 2004   LASIK  . LASIK Bilateral 2004  . PARTIAL MASTECTOMY WITH NEEDLE LOCALIZATION Left 02/14/2019   Procedure: PARTIAL MASTECTOMY WITH NEEDLE LOCALIZATION, LEFT;  Surgeon: Benjamine Sprague, DO;  Location: [facility] ORS;  Service: General;  Laterality: Left;  . TONSILLECTOMY AND ADENOIDECTOMY      FAMILY HISTORY Family History  Problem Relation Age of Onset  . Diabetes Mother   . Hypertension Mother   . Depression Mother   . Asthma Mother   . COPD Mother   . Macular degeneration Mother   . Heart disease Father   . Heart attack Father   . Lung cancer Father   . Aneurysm Father   . Cancer - Lung Father   . Cirrhosis Sister   . Alcohol abuse Sister   . Diabetes Maternal Uncle   . Heart disease Maternal Uncle   . Uterine cancer Maternal Grandmother   . Throat cancer Maternal Grandfather   . Arthritis Paternal Grandmother   . Stroke Paternal Grandmother   . Heart attack Paternal Grandfather   . Breast cancer Neg Hx     SOCIAL HISTORY Social History   Tobacco Use  . Smoking status: Never Smoker  . Smokeless tobacco: Never Used  Vaping Use  . Vaping Use: Never used  Substance Use Topics  . Alcohol use: Yes    Comment: Ocassionally  . Drug use: No         OPHTHALMIC EXAM:  Base Eye Exam    Visual Acuity (Snellen - Linear)      Right Left   Dist [place] 20/25 +1 20/80 -2   Dist ph [place] NI 20/25 -1       Tonometry  (Tonopen, 8:52 AM)      Right Left   Pressure 22 22       Pupils      Dark Light Shape React APD   Right 5 4 Round Brisk 0   Left 5 4 Round Brisk 0       Visual Fields      Left Right    Full Full  Extraocular Movement      Right Left    Full Full       Neuro/Psych    Oriented x3: Yes   Mood/Affect: Normal       Dilation    Both eyes: 1.0% Mydriacyl, 2.5% Phenylephrine @ 8:52 AM        Slit Lamp and Fundus Exam    Slit Lamp Exam      Right Left   Lids/Lashes Dermatochalasis - upper lid Dermatochalasis - upper lid   Conjunctiva/Sclera White and quiet White and quiet   Cornea well healed temporal cataract wounds, trace Debris in tear film, barely visible lasik flap well healed temporal cataract wounds, trace Debris in tear film, barely lasik flap   Anterior Chamber Deep and quiet Deep and quiet   Iris Round and dilated Round and dilated   Lens PC IOL in good position, trace PC folds PC IOL in good position, trace PC folds   Vitreous Vitreous syneresis, Posterior vitreous detachment, Weiss ring Vitreous syneresis, vitreous condensations       Fundus Exam      Right Left   Disc Anomalous, severe tilt, inferior PPA, punctate heme at 0200 resolved Anomalous, severe tilt, inferior PPA   C/D Ratio flat flat   Macula Flat, Blunted foveal reflex, RPE mottling and clumping, No heme or edema Flat, good foveal reflex, RPE mottling and clumping, No heme or edema   Vessels Vascular attenuation, Tortuous Vascular attenuation, mild Tortuousity   Periphery Attached, blonde peripheral fundus, inferior paving stone, peripheral, pigmented cystoid degeneration, No RT/RD on 360 exam Attached             IMAGING AND PROCEDURES  Imaging and Procedures for @TODAY @  OCT, Retina - OU - Both Eyes       Right Eye Quality was good. Central Foveal Thickness: 303. Progression has improved. Findings include normal foveal contour, no IRF, no SRF, myopic contour (Trace vitreous  opacities - resolved).   Left Eye Quality was good. Central Foveal Thickness: 305. Progression has been stable. Findings include normal foveal contour, no IRF, no SRF, myopic contour, vitreomacular adhesion .   Notes *Images captured and stored on drive  Diagnosis / Impression:  NFP, no IRF/SRF OU OD: tr vit opacities resolved  Clinical management:  See below  Abbreviations: NFP - Normal foveal profile. CME - cystoid macular edema. PED - pigment epithelial detachment. IRF - intraretinal fluid. SRF - subretinal fluid. EZ - ellipsoid zone. ERM - epiretinal membrane. ORA - outer retinal atrophy. ORT - outer retinal tubulation. SRHM - subretinal hyper-reflective material                 ASSESSMENT/PLAN:    ICD-10-CM   1. Posterior vitreous detachment of right eye  H43.811   2. Retinal edema  H35.81 OCT, Retina - OU - Both Eyes  3. Essential hypertension  I10   4. Hypertensive retinopathy of both eyes  H35.033   5. Pseudophakia of both eyes  Z96.1   6. History of myopia  Z86.69   7. Hx of LASIK  Z98.890     1,2. PVD / vitreous syneresis OD  - acute symptomatic floaters OD x4 days prior to initial presentation (5.28.21), no photopsias  - Discussed findings and prognosis  - No RT or RD on 360 scleral depressed exam  - Reviewed s/s of RT/RD  - Strict return precautions for any such RT/RD signs/symptoms  - pt is cleared from a retina standpoint for release to  Dr. Ellin Mayhew and resumption of primary eye care  3,4. Hypertensive retinopathy OU  - discussed importance of tight BP control  - monitor  5. Pseudophakia OU  - s/p CE/IOL OU (Dr. Lyndel Safe, 2020)  - beautiful surgeries, doing well  - monitor  6,7. Hx of Severe myopia s/p lasik OU  - Dr. Isaiah Blakes ([place])   - discussed association of high myopia with lattice degeneration and increased risk of RT/RD      Ophthalmic Meds Ordered this visit:  No orders of the defined types were placed in this encounter.       Return if symptoms worsen or fail to improve.  There are no Patient Instructions on file for this visit.   Explained the diagnoses, plan, and follow up with the patient and they expressed understanding.  Patient expressed understanding of the importance of proper follow up care.   This document serves as a record of services personally performed by Gardiner Sleeper, MD, PhD. It was created on their behalf by San Jetty. Owens Shark, COT, an ophthalmic technician. The creation of this record is the provider's dictation and/or activities during the visit.    Electronically signed by: San Jetty. Owens Shark, [place] 07.01.2021 11:46 PM   Gardiner Sleeper, M.D., Ph.D. Diseases & Surgery of the Retina and Vitreous [facility]  I have reviewed the above documentation for accuracy and completeness, and I agree with the above. Gardiner Sleeper, M.D., Ph.D. 01/09/20 11:46 PM   Abbreviations: M myopia (nearsighted); A astigmatism; H hyperopia (farsighted); P presbyopia; Mrx spectacle prescription;  CTL contact lenses; OD right eye; OS left eye; OU both eyes  XT exotropia; ET esotropia; PEK punctate epithelial keratitis; PEE punctate epithelial erosions; DES dry eye syndrome; MGD meibomian gland dysfunction; ATs artificial tears; PFAT's preservative free artificial tears; [place] nuclear sclerotic cataract; PSC posterior subcapsular cataract; ERM epi-retinal membrane; PVD posterior vitreous detachment; RD retinal detachment; DM diabetes mellitus; DR diabetic retinopathy; NPDR non-proliferative diabetic retinopathy; PDR proliferative diabetic retinopathy; CSME clinically significant macular edema; DME diabetic macular edema; dbh dot blot hemorrhages; CWS cotton wool spot; POAG primary open angle glaucoma; C/D cup-to-disc ratio; HVF humphrey visual field; GVF goldmann visual field; OCT optical coherence tomography; IOP intraocular pressure; BRVO Branch retinal vein occlusion; CRVO central retinal vein  occlusion; CRAO central retinal artery occlusion; BRAO branch retinal artery occlusion; RT retinal tear; SB scleral buckle; PPV pars plana vitrectomy; VH Vitreous hemorrhage; PRP panretinal laser photocoagulation; IVK intravitreal kenalog; VMT vitreomacular traction; MH Macular hole;  NVD neovascularization of the disc; NVE neovascularization elsewhere; AREDS age related eye disease study; ARMD age related macular degeneration; POAG primary open angle glaucoma; EBMD epithelial/anterior basement membrane dystrophy; ACIOL anterior chamber intraocular lens; IOL intraocular lens; PCIOL posterior chamber intraocular lens; Phaco/IOL phacoemulsification with intraocular lens placement; [place] photorefractive keratectomy; LASIK laser assisted in situ keratomileusis; HTN hypertension; DM diabetes mellitus; COPD chronic obstructive pulmonary disease

## 2020-01-09 ENCOUNTER — Other Ambulatory Visit: Payer: Self-pay

## 2020-01-09 ENCOUNTER — Encounter (INDEPENDENT_AMBULATORY_CARE_PROVIDER_SITE_OTHER): Payer: Self-pay | Admitting: Ophthalmology

## 2020-01-09 ENCOUNTER — Ambulatory Visit (INDEPENDENT_AMBULATORY_CARE_PROVIDER_SITE_OTHER): Payer: BC Managed Care – PPO | Admitting: Ophthalmology

## 2020-01-09 DIAGNOSIS — H3581 Retinal edema: Secondary | ICD-10-CM | POA: Diagnosis not present

## 2020-01-09 DIAGNOSIS — H35033 Hypertensive retinopathy, bilateral: Secondary | ICD-10-CM | POA: Diagnosis not present

## 2020-01-09 DIAGNOSIS — H43811 Vitreous degeneration, right eye: Secondary | ICD-10-CM

## 2020-01-09 DIAGNOSIS — I10 Essential (primary) hypertension: Secondary | ICD-10-CM

## 2020-01-09 DIAGNOSIS — Z9889 Other specified postprocedural states: Secondary | ICD-10-CM

## 2020-01-09 DIAGNOSIS — Z8669 Personal history of other diseases of the nervous system and sense organs: Secondary | ICD-10-CM

## 2020-01-09 DIAGNOSIS — Z961 Presence of intraocular lens: Secondary | ICD-10-CM

## 2020-01-14 NOTE — Progress Notes (Signed)
Established patient visit   Patient: Mikayla Munoz   DOB: July 28, 1955   64 y.o. Female  MRN: [id number] Visit Date: 01/15/2020  I,Sulibeya S Dimas,acting as a scribe for Wilhemena Durie, MD.,have documented all relevant documentation on the behalf of Wilhemena Durie, MD,as directed by  Wilhemena Durie, MD while in the presence of Wilhemena Durie, MD.  Today's healthcare provider: Wilhemena Durie, MD   Chief Complaint  Patient presents with  . Follow-up   Subjective    HPI  Follow up for hot flashes  The patient was last seen for this 2 months ago. Changes made at last visit include start Venlafaxine XR 37.5 mg daily.  She reports excellent compliance with treatment. She feels that condition is Improved. She is not having side effects.  She states that she is at least 75 to 80% better. -----------------------------------------------------------------------------------------    Patient Active Problem List   Diagnosis Date Noted  . Atypical ductal hyperplasia of left breast 01/23/2019  . Adult attention deficit disorder 11/18/2014  . Allergic rhinitis 11/18/2014  . Anxiety 11/18/2014  . Clinical depression 11/18/2014  . Genital herpes 11/18/2014  . Hypercholesteremia 11/18/2014  . Adult hypothyroidism 11/18/2014  . Cannot sleep 11/18/2014  . Arthritis, degenerative 11/18/2014  . Adiposity 11/18/2014  . Obstructive apnea 11/18/2014  . Avitaminosis D 11/18/2014   Social History   Tobacco Use  . Smoking status: Never Smoker  . Smokeless tobacco: Never Used  Vaping Use  . Vaping Use: Never used  Substance Use Topics  . Alcohol use: Yes    Comment: Ocassionally  . Drug use: No       Medications: Outpatient Medications Prior to Visit  Medication Sig  . Biotin 10 MG CAPS Take 10 mg by mouth daily.  . Brimonidine Tartrate (LUMIFY) 0.025 % SOLN Place 1 drop into both eyes daily as needed (for allergies).  . Cholecalciferol (VITAMIN D) 2000 UNITS  tablet Take 2,000 Units by mouth daily.   . clobetasol cream (TEMOVATE) 0.05 % Apply topically as directed. Qd to bid aa rash on back until clear, avoid face, groin, axilla  . FINACEA 15 % FOAM Apply A SMALL AMOUNT TO SKIN TWICE A DAY  . fluticasone (FLONASE) 50 MCG/ACT nasal spray Place 1-2 sprays into both nostrils daily as needed for allergies or rhinitis.  Marland Kitchen ibuprofen (ADVIL) 800 MG tablet Take 1 tablet (800 mg total) by mouth every 8 (eight) hours as needed for mild pain or moderate pain.  Marland Kitchen loratadine (CLARITIN) 10 MG tablet Take 10 mg by mouth daily.   . Lutein-Zeaxanthin 25-5 MG CAPS Take 1 capsule by mouth daily.   . rosuvastatin (CRESTOR) 5 MG tablet Take 1 tablet (5 mg total) by mouth at bedtime.  Marland Kitchen SYNTHROID 50 MCG tablet Take 1 tablet by mouth once daily  . triamcinolone (NASACORT) 55 MCG/ACT AERO nasal inhaler Place into the nose.  . valACYclovir (VALTREX) 500 MG tablet Take 1 tablet by mouth twice daily  . venlafaxine XR (EFFEXOR XR) 37.5 MG 24 hr capsule Take 1 capsule (37.5 mg total) by mouth daily with breakfast.  . [DISCONTINUED] cyclobenzaprine (FLEXERIL) 10 MG tablet Take 1 tablet (10 mg total) by mouth 3 (three) times daily as needed. (Patient not taking: Reported on 01/15/2020)  . [DISCONTINUED] lidocaine (LIDODERM) 5 % Place 1 patch onto the skin every 12 (twelve) hours. Remove & Discard patch within 12 hours or as directed by MD (Patient not taking: Reported on 01/15/2020)  . [  DISCONTINUED] Lutein 10 MG TABS Take by mouth. (Patient not taking: Reported on 01/15/2020)  . [DISCONTINUED] traMADol (ULTRAM) 50 MG tablet Take 1 tablet (50 mg total) by mouth every 6 (six) hours as needed for severe pain. (Patient not taking: Reported on 01/15/2020)   No facility-administered medications prior to visit.    Review of Systems  Constitutional: Negative.   Respiratory: Negative.   Cardiovascular: Negative.     Last CBC Lab Results  Component Value Date   WBC 6.4 11/08/2019   HGB  14.2 11/08/2019   HCT 42.1 11/08/2019   MCV 89 11/08/2019   MCH 30.1 11/08/2019   RDW 12.5 11/08/2019   PLT 343 35/32/9924   Last metabolic panel Lab Results  Component Value Date   GLUCOSE 96 11/08/2019   NA 141 11/08/2019   K 4.7 11/08/2019   CL 102 11/08/2019   CO2 20 11/08/2019   BUN 10 11/08/2019   CREATININE 0.80 11/08/2019   GFRNONAA 79 11/08/2019   GFRAA 91 11/08/2019   CALCIUM 9.8 11/08/2019   PROT 7.0 11/08/2019   ALBUMIN 4.5 11/08/2019   LABGLOB 2.5 11/08/2019   AGRATIO 1.8 11/08/2019   BILITOT 0.4 11/08/2019   ALKPHOS 115 11/08/2019   AST 22 11/08/2019   ALT 28 11/08/2019   Last lipids Lab Results  Component Value Date   CHOL 162 11/08/2019   HDL 62 11/08/2019   LDLCALC 82 11/08/2019   TRIG 102 11/08/2019   CHOLHDL 2.6 11/08/2019   Last thyroid functions Lab Results  Component Value Date   TSH 3.360 11/08/2019      Objective    BP 109/71 (BP Location: Right Arm, Patient Position: Sitting, Cuff Size: Large)   Pulse 76   Temp (!) 97.3 F (36.3 C) (Temporal)   Resp 16   Ht 5' 4.5" (1.638 m)   Wt 200 lb (90.7 kg)   BMI 33.80 kg/m  BP Readings from Last 3 Encounters:  01/15/20 109/71  11/07/19 113/76  08/15/19 115/77   Wt Readings from Last 3 Encounters:  01/15/20 200 lb (90.7 kg)  11/07/19 209 lb 3.2 oz (94.9 kg)  08/15/19 203 lb 12.8 oz (92.4 kg)      Physical Exam Vitals reviewed.  Constitutional:      General: She is not in acute distress.    Appearance: She is well-developed.  HENT:     Head: Normocephalic and atraumatic.  Eyes:     General: No scleral icterus.    Conjunctiva/sclera: Conjunctivae normal.  Cardiovascular:     Rate and Rhythm: Normal rate and regular rhythm.  Pulmonary:     Effort: Pulmonary effort is normal. No respiratory distress.  Abdominal:     Palpations: Abdomen is soft.  Musculoskeletal:     Right lower leg: No edema.     Left lower leg: No edema.  Skin:    General: Skin is warm and dry.      Findings: No rash.  Neurological:     General: No focal deficit present.     Mental Status: She is alert and oriented to person, place, and time.  Psychiatric:        Mood and Affect: Mood normal.        Behavior: Behavior normal.        Thought Content: Thought content normal.        Judgment: Judgment normal.      No results found for any visits on 01/15/20.  Assessment & Plan  Problem List Items Addressed This Visit      Cardiovascular and Mediastinum   Hot flashes - Primary    Improved Continue Venlafaxine XR 37.5 mg daily Follow up in May 2022 for CPE      Relevant Medications   rosuvastatin (CRESTOR) 5 MG tablet     Other   Hypercholesteremia   Relevant Medications   rosuvastatin (CRESTOR) 5 MG tablet    Other Visit Diagnoses    Postmenopausal           No follow-ups on file.      I, Wilhemena Durie, MD, have reviewed all documentation for this visit. The documentation on 01/16/20 for the exam, diagnosis, procedures, and orders are all accurate and complete.    Richard Cranford Mon, MD  [facility] [phone] (phone) [phone] (fax)  [place]

## 2020-01-15 ENCOUNTER — Other Ambulatory Visit: Payer: Self-pay

## 2020-01-15 ENCOUNTER — Ambulatory Visit: Payer: BC Managed Care – PPO | Admitting: Family Medicine

## 2020-01-15 ENCOUNTER — Encounter: Payer: Self-pay | Admitting: Family Medicine

## 2020-01-15 VITALS — BP 109/71 | HR 76 | Temp 97.3°F | Resp 16 | Ht 64.5 in | Wt 200.0 lb

## 2020-01-15 DIAGNOSIS — R232 Flushing: Secondary | ICD-10-CM | POA: Diagnosis not present

## 2020-01-15 DIAGNOSIS — Z780 Asymptomatic menopausal state: Secondary | ICD-10-CM

## 2020-01-15 DIAGNOSIS — E7800 Pure hypercholesterolemia, unspecified: Secondary | ICD-10-CM | POA: Diagnosis not present

## 2020-01-15 MED ORDER — ROSUVASTATIN CALCIUM 5 MG PO TABS: 5.0000 mg | ORAL_TABLET | Freq: Every day | ORAL | 3 refills | Status: DC

## 2020-01-15 NOTE — Assessment & Plan Note (Signed)
Improved Continue Venlafaxine XR 37.5 mg daily Follow up in May 2022 for CPE

## 2020-03-03 ENCOUNTER — Ambulatory Visit: Payer: Self-pay | Admitting: Family Medicine

## 2020-03-20 ENCOUNTER — Telehealth: Payer: Self-pay | Admitting: Family Medicine

## 2020-03-20 DIAGNOSIS — T753XXD Motion sickness, subsequent encounter: Secondary | ICD-10-CM

## 2020-03-20 NOTE — Telephone Encounter (Signed)
Please advise 

## 2020-03-20 NOTE — Telephone Encounter (Signed)
Copied from [place] [phone]. Topic: General - Inquiry >> Mar 17, 2020  1:50 PM Percell Belt A wrote: Reason for CRM: pt called and stated she is going on 2 cruises  next Friday and needs the patches for motions sickness.  She is going for almost a month and will need enough for both [place] >> Mar 20, 2020  9:30 AM Celene Kras wrote: Pt calling again stating that she is still waiting on a response to getting this medication. She states that she will be gone for 39 days. Please advise.

## 2020-03-23 NOTE — Telephone Encounter (Signed)
Patient is calling again to get motion sickness patches.  Patient has called several times because she leaves for a cruise on Thursday and really needs this medication.  Please advise and send asap.

## 2020-03-24 MED ORDER — SCOPOLAMINE 1 MG/3DAYS TD PT72: 1.0000 | MEDICATED_PATCH | TRANSDERMAL | 0 refills | Status: DC

## 2020-03-24 NOTE — Telephone Encounter (Signed)
Rx sent to pharmacy. Left message on pt's vm advising.

## 2020-03-24 NOTE — Telephone Encounter (Signed)
Please advise 

## 2020-03-24 NOTE — Telephone Encounter (Signed)
Transderm scopolamine patches --change every 3 days,Box of 4

## 2020-03-26 ENCOUNTER — Other Ambulatory Visit: Payer: Self-pay | Admitting: Family Medicine

## 2020-03-26 DIAGNOSIS — T753XXD Motion sickness, subsequent encounter: Secondary | ICD-10-CM

## 2020-03-26 MED ORDER — SCOPOLAMINE 1 MG/3DAYS TD PT72: 1.0000 | MEDICATED_PATCH | TRANSDERMAL | 0 refills | Status: AC

## 2020-03-26 NOTE — Telephone Encounter (Signed)
OK to call in 4 more boxes.

## 2020-03-26 NOTE — Telephone Encounter (Signed)
Patient requesting increase in number- due to upcoming travel- sent for review

## 2020-03-26 NOTE — Telephone Encounter (Signed)
PT need 10 patches / she's leaving town  scopolamine (TRANSDERM-SCOP) 1 MG/3DAYS [[id number]]  [facility] [address], [place] - [place]  [address] [place] [address]  Phone:  [phone] Fax:  [phone]

## 2020-03-26 NOTE — Telephone Encounter (Signed)
Sent to [facility] for patient.

## 2020-03-26 NOTE — Telephone Encounter (Signed)
Patient is calling to request if Dr. Rosanna Randy can send more patches in. Patient is traveling on 2 separate cruises. For a total of 39 days. 4 patches were sent to the pharmacy for her. Patient states that the 4 patches will only last about 9 days. Patient is leaving for her cruise in the morning. And is asking Dr. Rosanna Randy if he could please send more patches for her. Please advise [phone] [facility].

## 2020-05-04 ENCOUNTER — Other Ambulatory Visit: Payer: Self-pay | Admitting: Family Medicine

## 2020-05-04 NOTE — Telephone Encounter (Signed)
Requested Prescriptions  Pending Prescriptions Disp Refills  . valACYclovir (VALTREX) 500 MG tablet [Pharmacy Med Name: valACYclovir HCl 500 MG Oral Tablet] 60 tablet 0    Sig: Take 1 tablet by mouth twice daily     Antimicrobials:  Antiviral Agents - Anti-Herpetic Passed - 05/04/2020 10:10 AM      Passed - Valid encounter within last 12 months    Recent Outpatient Visits          3 months ago Hot flashes   [facility] Jerrol Banana., MD   5 months ago Annual physical exam   [facility] Jerrol Banana., MD   8 months ago [employer] of foot   [facility] Jerrol Banana., MD   1 year ago URI with cough and congestion   [place], Vickki Muff, [place]   1 year ago [place] Jerrol Banana., MD      Future Appointments            In 6 months Jerrol Banana., MD [facility], [place]

## 2020-06-12 ENCOUNTER — Other Ambulatory Visit: Payer: Self-pay | Admitting: Family Medicine

## 2020-07-15 ENCOUNTER — Other Ambulatory Visit: Payer: Self-pay | Admitting: Family Medicine

## 2020-08-11 IMAGING — MG DIGITAL SCREENING BILATERAL MAMMOGRAM WITH TOMO AND CAD
8 series · 8 of 24 positions shown · non-contrast
Comparison: Previous exam(s).

CLINICAL DATA: Screening.

EXAM:
DIGITAL SCREENING BILATERAL MAMMOGRAM WITH TOMO AND CAD

[R CC synth-2D]
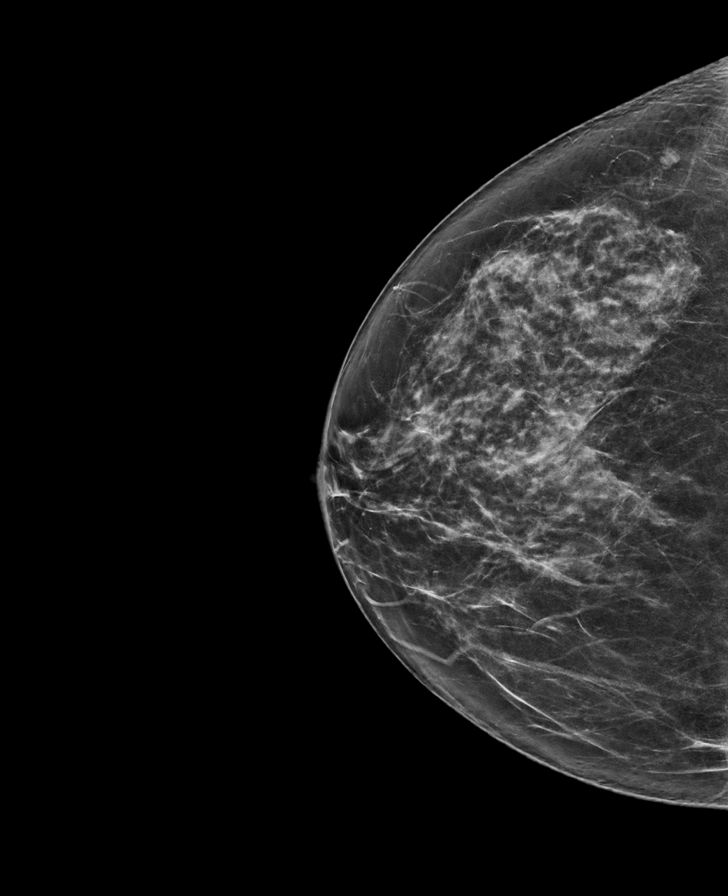

[L CC synth-2D]
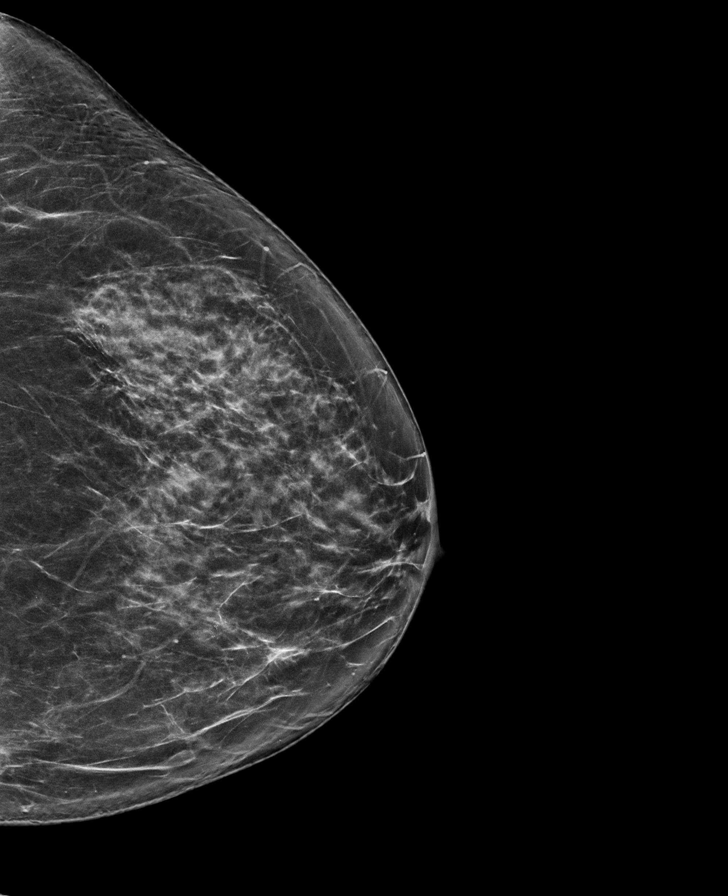

[L MLO synth-2D]
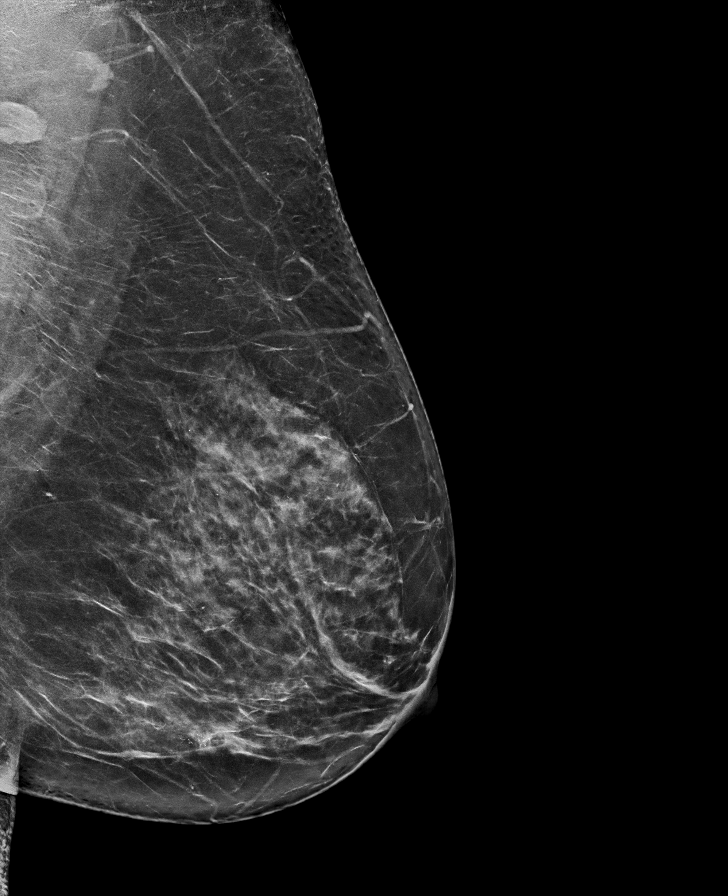

[R MLO synth-2D]
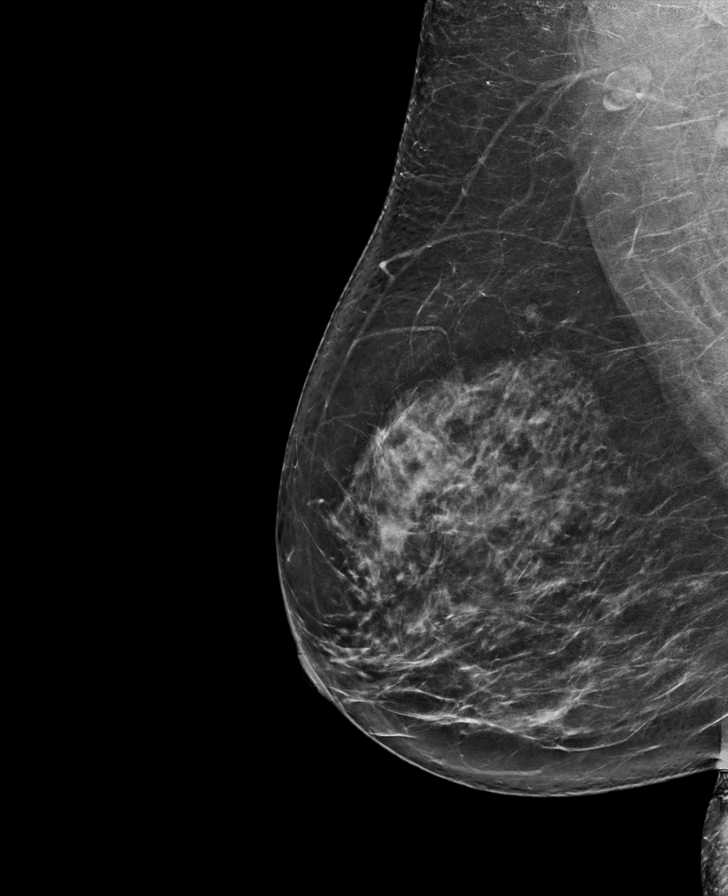

[L MLO tomo · tomo slice 43/84.0]
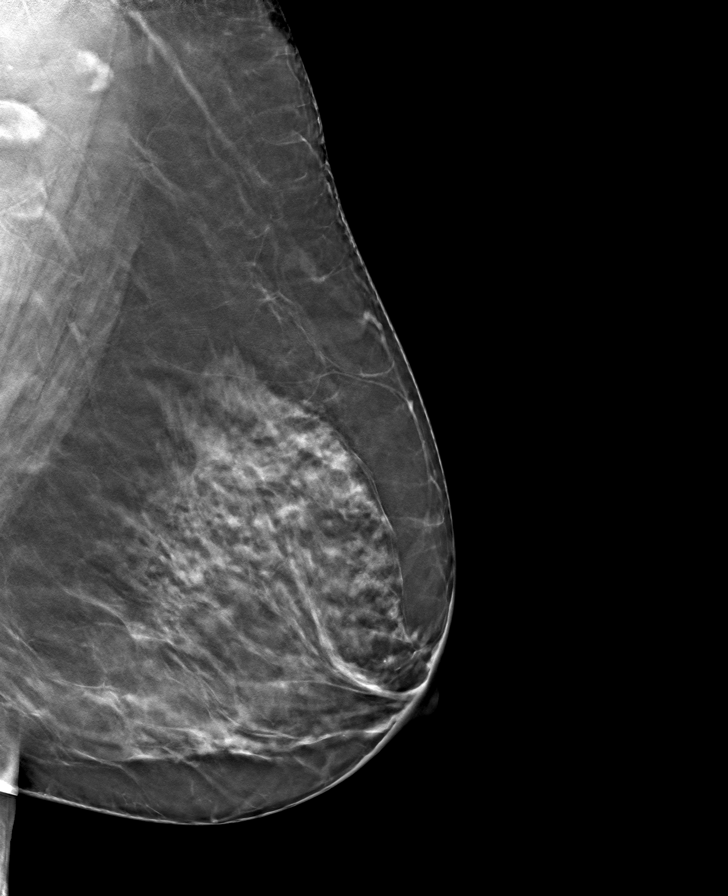

[L CC tomo · tomo slice 40/79.0]
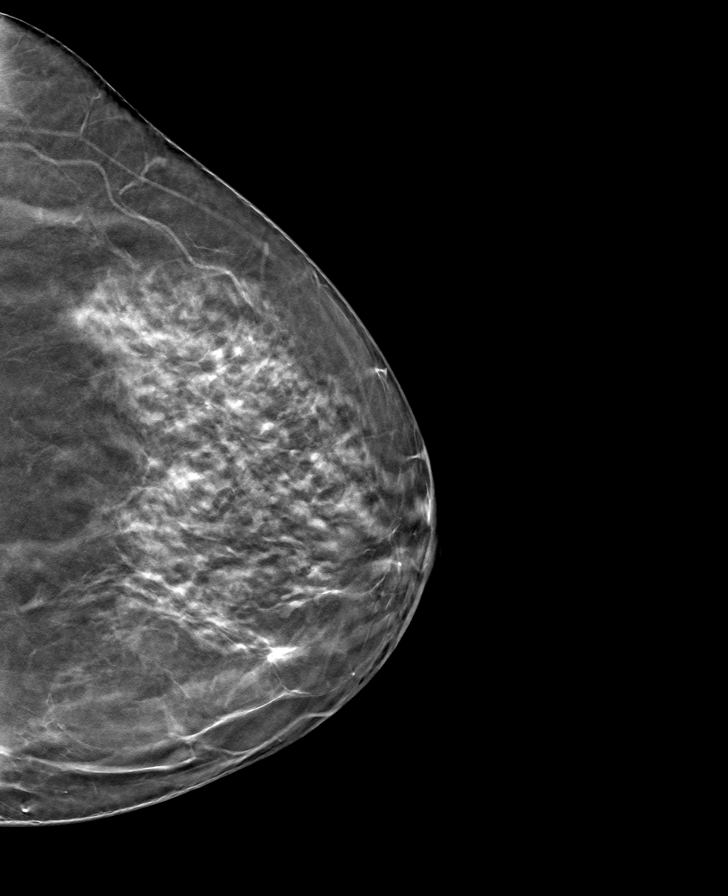

[R MLO tomo · tomo slice 43/84.0]
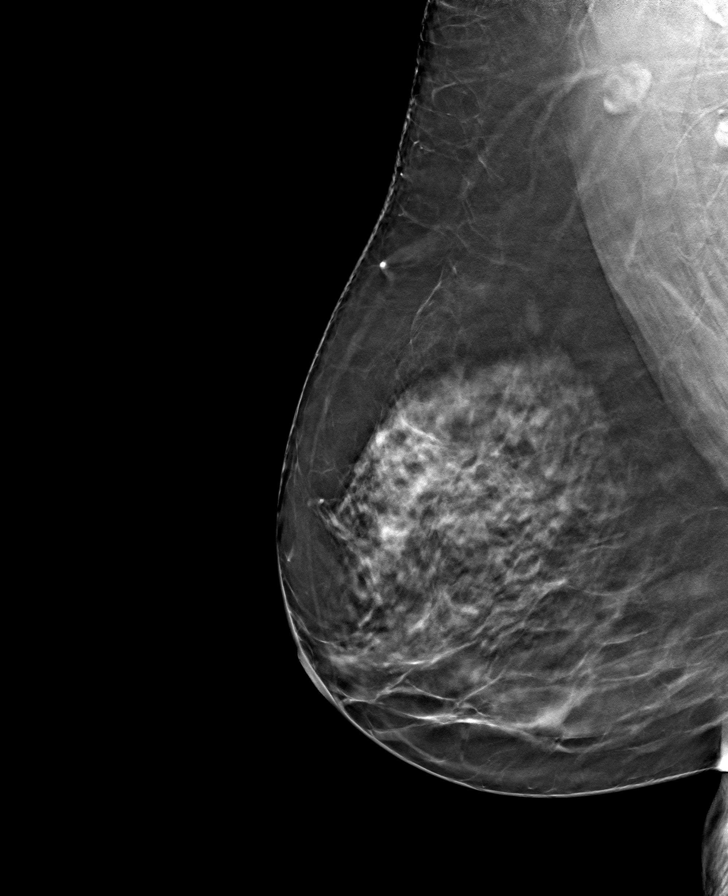

[R CC tomo · tomo slice 40/79.0]
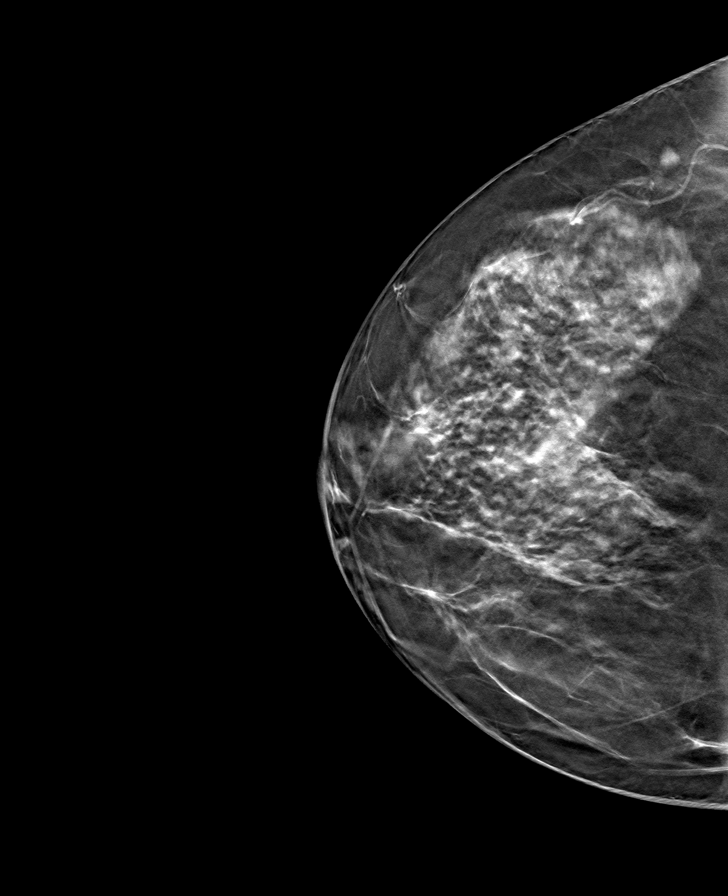

[8 of 24 positions shown; findings below may reference images not displayed]

ACR Breast Density Category c: The breast tissue is heterogeneously
dense, which may obscure small masses.
FINDINGS: In the left breast, calcifications warrant further evaluation. In
the right breast, no findings suspicious for malignancy. Images were
processed with CAD.
IMPRESSION: Further evaluation is suggested for calcifications in the left
breast.

RECOMMENDATION:
Diagnostic mammogram of the left breast. (Code:[id number])

The patient will be contacted regarding the findings, and additional
imaging will be scheduled.

BI-RADS CATEGORY  0: Incomplete. Need additional imaging evaluation
and/or prior mammograms for comparison.

## 2020-08-19 ENCOUNTER — Other Ambulatory Visit: Payer: Self-pay | Admitting: Family Medicine

## 2020-08-19 ENCOUNTER — Other Ambulatory Visit: Payer: Self-pay

## 2020-08-19 ENCOUNTER — Ambulatory Visit: Payer: BC Managed Care – PPO

## 2020-09-09 ENCOUNTER — Other Ambulatory Visit: Payer: Self-pay | Admitting: Family Medicine

## 2020-11-10 ENCOUNTER — Encounter: Payer: Self-pay | Admitting: Family Medicine

## 2020-12-08 ENCOUNTER — Other Ambulatory Visit: Payer: Self-pay | Admitting: Family Medicine

## 2020-12-08 DIAGNOSIS — F419 Anxiety disorder, unspecified: Secondary | ICD-10-CM

## 2020-12-08 DIAGNOSIS — R232 Flushing: Secondary | ICD-10-CM

## 2020-12-08 DIAGNOSIS — Z780 Asymptomatic menopausal state: Secondary | ICD-10-CM

## 2021-01-08 ENCOUNTER — Ambulatory Visit: Payer: Self-pay | Admitting: *Deleted

## 2021-01-08 NOTE — Telephone Encounter (Signed)
Pt called stating that she tested positive for covid today. She states that she is having a headache, some fatigue, some fever, congestion in chest. Pt is requesting to have a nurse give a call back to discuss next steps. Please advise.  Reason for Disposition  [1] COVID-19 diagnosed by positive lab test (e.g., PCR, rapid self-test kit) AND [2] mild symptoms (e.g., cough, fever, others) AND [3] no complications or SOB  Answer Assessment - Initial Assessment Questions 1. COVID-19 DIAGNOSIS: "Who made your COVID-19 diagnosis?" "Was it confirmed by a positive lab test or self-test?" If not diagnosed by a doctor (or NP/PA), ask "Are there lots of cases (community spread) where you live?" Note: See public health department website, if unsure.     + COVID home test 2. COVID-19 EXPOSURE: "Was there any known exposure to COVID before the symptoms began?" CDC Definition of close contact: within 6 feet (2 meters) for a total of 15 minutes or more over a 24-hour period.      Yes- out of town exposure 3. ONSET: "When did the COVID-19 symptoms start?"      Monday -1 week ago 4. WORST SYMPTOM: "What is your worst symptom?" (e.g., cough, fever, shortness of breath, muscle aches)     Headache, scratchy throat 5. COUGH: "Do you have a cough?" If Yes, ask: "How bad is the cough?"       Yes- worse last week 6. FEVER: "Do you have a fever?" If Yes, ask: "What is your temperature, how was it measured, and when did it start?"     no 7. RESPIRATORY STATUS: "Describe your breathing?" (e.g., shortness of breath, wheezing, unable to speak)      Not as good as normal- congestion present 8. BETTER-SAME-WORSE: "Are you getting better, staying the same or getting worse compared to yesterday?"  If getting worse, ask, "In what way?"     Same- not better- but not worse 9. HIGH RISK DISEASE: "Do you have any chronic medical problems?" (e.g., asthma, heart or lung disease, weak immune system, obesity, etc.)     no 10. VACCINE:  "Have you had the COVID-19 vaccine?" If Yes, ask: "Which one, how many shots, when did you get it?"       Yes- pfizer  11. BOOSTER: "Have you received your COVID-19 booster?" If Yes, ask: "Which one and when did you get it?"       Yes- both, moderna 12. PREGNANCY: "Is there any chance you are pregnant?" "When was your last menstrual period?"       N/a 13. OTHER SYMPTOMS: "Do you have any other symptoms?"  (e.g., chills, fatigue, headache, loss of smell or taste, muscle pain, sore throat)       Headache, congestion 14. O2 SATURATION MONITOR:  "Do you use an oxygen saturation monitor (pulse oximeter) at home?" If Yes, ask "What is your reading (oxygen level) today?" "What is your usual oxygen saturation reading?" (e.g., 95%)       no  Protocols used: Coronavirus (COVID-19) Diagnosed or Suspected-A-AH

## 2021-01-08 NOTE — Telephone Encounter (Signed)
Patient is calling to report both she and her husband have tested + COVID. Patient states she worst symptoms were last week- but she does have headache, cough , chest congestion. Patient advised per protocol and reviewed emergent care and virtual visit access for weekend . Patient is presently using hydration and mucinex for her symptoms. She does have history of Bronchitis and will watch for those symptoms.

## 2021-01-15 ENCOUNTER — Other Ambulatory Visit: Payer: Self-pay | Admitting: Family Medicine

## 2021-01-15 NOTE — Telephone Encounter (Signed)
Requested medications are due for refill today yes  Requested medications are on the active medication list yes  Last refill 3/9  Last visit 01/2020  Future visit scheduled 03/2021  Notes to clinic Lab work is from 10/2019 which fails protocol of labs within 360 days, pt does have appt in 03/17/21. Please assess.

## 2021-02-16 ENCOUNTER — Other Ambulatory Visit: Payer: Self-pay | Admitting: Family Medicine

## 2021-02-16 DIAGNOSIS — E7800 Pure hypercholesterolemia, unspecified: Secondary | ICD-10-CM

## 2021-02-16 NOTE — Telephone Encounter (Signed)
Requested medication (s) are due for refill today: yes  Requested medication (s) are on the active medication list: yes   Last refill:  10/09/2020  Future visit scheduled: yes  Notes to clinic:  Failed protocol: Total Cholesterol in normal range and within 360 days   LDL in normal range and within 360 days   HDL in normal range and within 360 days   Triglycerides in normal range and within 360 days   Valid encounter within last 12 months     Requested Prescriptions  Pending Prescriptions Disp Refills   rosuvastatin (CRESTOR) 5 MG tablet [Pharmacy Med Name: Rosuvastatin Calcium 5 MG Oral Tablet] 90 tablet 0    Sig: TAKE 1 TABLET BY MOUTH AT BEDTIME      Cardiovascular:  Antilipid - Statins Failed - 02/16/2021  7:18 AM      Failed - Total Cholesterol in normal range and within 360 days    Cholesterol, Total  Date Value Ref Range Status  11/08/2019 162 100 - 199 mg/dL Final          Failed - LDL in normal range and within 360 days    LDL Cholesterol (Calc)  Date Value Ref Range Status  03/31/2017 63 mg/dL (calc) Final    Comment:    Reference range: <100 . Desirable range <100 mg/dL for primary prevention;   <70 mg/dL for patients with CHD or diabetic patients  with > or = 2 CHD risk factors. Marland Kitchen LDL-C is now calculated using the Martin-Hopkins  calculation, which is a validated novel method providing  better accuracy than the Friedewald equation in the  estimation of LDL-C.  Cresenciano Genre et al. Annamaria Helling. [id number]): 2061-2068  (http://education.[facility].com/faq/FAQ164)    LDL Chol Calc (NIH)  Date Value Ref Range Status  11/08/2019 82 0 - 99 mg/dL Final          Failed - HDL in normal range and within 360 days    HDL  Date Value Ref Range Status  11/08/2019 62 >39 mg/dL Final          Failed - Triglycerides in normal range and within 360 days    Triglycerides  Date Value Ref Range Status  11/08/2019 102 0 - 149 mg/dL Final          Failed - Valid  encounter within last 12 months    Recent Outpatient Visits           1 year ago Hot flashes   [facility] Jerrol Banana., MD   1 year ago Annual physical exam   [facility] Jerrol Banana., MD   1 year ago Corn of foot   [facility] Jerrol Banana., MD   2 years ago URI with cough and congestion   [place], Vickki Muff, [place]   2 years ago [place] Jerrol Banana., MD       Future Appointments             In 4 weeks Jerrol Banana., MD [facility], [place] - Patient is not pregnant

## 2021-03-17 ENCOUNTER — Encounter: Payer: Self-pay | Admitting: Family Medicine

## 2021-03-17 ENCOUNTER — Ambulatory Visit (INDEPENDENT_AMBULATORY_CARE_PROVIDER_SITE_OTHER): Payer: BC Managed Care – PPO | Admitting: Family Medicine

## 2021-03-17 ENCOUNTER — Other Ambulatory Visit: Payer: Self-pay

## 2021-03-17 VITALS — BP 125/66 | HR 75 | Resp 16 | Ht 64.5 in | Wt 205.0 lb

## 2021-03-17 DIAGNOSIS — F419 Anxiety disorder, unspecified: Secondary | ICD-10-CM | POA: Diagnosis not present

## 2021-03-17 DIAGNOSIS — R232 Flushing: Secondary | ICD-10-CM

## 2021-03-17 DIAGNOSIS — Z780 Asymptomatic menopausal state: Secondary | ICD-10-CM

## 2021-03-17 DIAGNOSIS — Z0000 Encounter for general adult medical examination without abnormal findings: Secondary | ICD-10-CM | POA: Diagnosis not present

## 2021-03-17 DIAGNOSIS — E7800 Pure hypercholesterolemia, unspecified: Secondary | ICD-10-CM

## 2021-03-17 DIAGNOSIS — Z6834 Body mass index (BMI) 34.0-34.9, adult: Secondary | ICD-10-CM

## 2021-03-17 DIAGNOSIS — F3341 Major depressive disorder, recurrent, in partial remission: Secondary | ICD-10-CM

## 2021-03-17 DIAGNOSIS — E6609 Other obesity due to excess calories: Secondary | ICD-10-CM

## 2021-03-17 DIAGNOSIS — J301 Allergic rhinitis due to pollen: Secondary | ICD-10-CM

## 2021-03-17 MED ORDER — VENLAFAXINE HCL ER 75 MG PO CP24
75.0000 mg | ORAL_CAPSULE | Freq: Every day | ORAL | 5 refills | Status: DC
Start: 2021-03-17 — End: 2021-09-20

## 2021-03-17 NOTE — Progress Notes (Signed)
Complete physical exam   Patient: Mikayla Munoz   DOB: 20-Jul-1955   65 y.o. Female  MRN: [id number] Visit Date: 03/17/2021  Today's healthcare provider: Wilhemena Durie, MD   Chief Complaint  Patient presents with   Annual Exam   Subjective    Mikayla Munoz is a 65 y.o. female who presents today for a complete physical exam.  She reports consuming a general diet. The patient does not participate in regular exercise at present. She generally feels well. She reports sleeping fairly well. She does not have additional problems to discuss today.  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  He is enjoying traveling with her husband.  He is working on diet and exercise and is lost 15 pounds over the past year.  Past Medical History:  Diagnosis Date   Complication of anesthesia    had to be admitted in 2010 after cone biopsy b/c unable to wake   Hypothyroidism    PONV (postoperative nausea and vomiting)    Past Surgical History:  Procedure Laterality Date   BREAST BIOPSY Left 01/08/2019   stereo calcs, x-clip, benign   BREAST BIOPSY Left 01/08/2019   stereo calcs, coil clip, ADH   BREAST LUMPECTOMY Left 02/14/2019   ADH   CARPAL TUNNEL RELEASE     CATARACT EXTRACTION Bilateral 05/2019   Dr. Lyndel Safe   CERVICAL CONE BIOPSY     CHOLECYSTECTOMY     COLONOSCOPY WITH PROPOFOL N/A 04/27/2018   Procedure: COLONOSCOPY WITH PROPOFOL;  Surgeon: Lollie Sails, MD;  Location: [facility] ENDOSCOPY;  Service: Endoscopy;  Laterality: N/A;   EYE SURGERY Bilateral 05/2019   Cat Sx - Dr. Lyndel Safe   EYE SURGERY Bilateral 2004   LASIK   LASIK Bilateral 2004   PARTIAL MASTECTOMY WITH NEEDLE LOCALIZATION Left 02/14/2019   Procedure: PARTIAL MASTECTOMY WITH NEEDLE LOCALIZATION, LEFT;  Surgeon: Benjamine Sprague, DO;  Location: [facility] ORS;  Service: General;  Laterality: Left;   TONSILLECTOMY AND ADENOIDECTOMY     Social History    Socioeconomic History   Marital status: Single    Spouse name: Not on file   Number of children: Not on file   Years of education: Not on file   Highest education level: Not on file  Occupational History   Not on file  Tobacco Use   Smoking status: Never   Smokeless tobacco: Never  Vaping Use   Vaping Use: Never used  Substance and Sexual Activity   Alcohol use: Yes    Comment: Ocassionally   Drug use: No   Sexual activity: Not on file  Other Topics Concern   Not on file  Social History Narrative   Not on file   Social Determinants of Health   Financial Resource Strain: Not on file  Food Insecurity: Not on file  Transportation Needs: Not on file  Physical Activity: Not on file  Stress: Not on file  Social Connections: Not on file  Intimate Partner Violence: Not on file   Family Status  Relation Name Status   Mother  Deceased   Father  Deceased   Sister  Deceased   Sister  Deceased   Mat Uncle  (Not Specified)   MGM  (Not Specified)   MGF  (Not Specified)   PGM  (Not Specified)   PGF  (Not Specified)   Neg Hx  (Not Specified)   Family History  Problem  Relation Age of Onset   Diabetes Mother    Hypertension Mother    Depression Mother    Asthma Mother    COPD Mother    Macular degeneration Mother    Heart disease Father    Heart attack Father    Lung cancer Father    Aneurysm Father    Cancer - Lung Father    Cirrhosis Sister    Alcohol abuse Sister    Diabetes Maternal Uncle    Heart disease Maternal Uncle    Uterine cancer Maternal Grandmother    Throat cancer Maternal Grandfather    Arthritis Paternal Grandmother    Stroke Paternal Grandmother    Heart attack Paternal Grandfather    Breast cancer Neg Hx    Allergies  Allergen Reactions   Codeine Nausea And Vomiting   Expectorant Cough Control [Guaifenesin] Nausea And Vomiting    Patient Care Team: Jerrol Banana., MD as PCP - General (Family Medicine)   Medications: Outpatient  Medications Prior to Visit  Medication Sig   Biotin 10 MG CAPS Take 10 mg by mouth daily.   Brimonidine Tartrate (LUMIFY) 0.025 % SOLN Place 1 drop into both eyes daily as needed (for allergies).   Cholecalciferol (VITAMIN D) 2000 UNITS tablet Take 2,000 Units by mouth daily.    clobetasol cream (TEMOVATE) 0.05 % Apply topically as directed. Qd to bid aa rash on back until clear, avoid face, groin, axilla   FINACEA 15 % FOAM Apply A SMALL AMOUNT TO SKIN TWICE A DAY   fluticasone (FLONASE) 50 MCG/ACT nasal spray Place 1-2 sprays into both nostrils daily as needed for allergies or rhinitis.   ibuprofen (ADVIL) 800 MG tablet Take 1 tablet (800 mg total) by mouth every 8 (eight) hours as needed for mild pain or moderate pain.   SYNTHROID 50 MCG tablet Take 1 tablet by mouth once daily   loratadine (CLARITIN) 10 MG tablet Take 10 mg by mouth daily.    Lutein-Zeaxanthin 25-5 MG CAPS Take 1 capsule by mouth daily.    rosuvastatin (CRESTOR) 5 MG tablet TAKE 1 TABLET BY MOUTH AT BEDTIME   scopolamine (TRANSDERM-SCOP) 1 MG/3DAYS Place 1 patch (1.5 mg total) onto the skin every 3 (three) days.   triamcinolone (NASACORT) 55 MCG/ACT AERO nasal inhaler Place into the nose.   valACYclovir (VALTREX) 500 MG tablet Take 1 tablet by mouth twice daily   venlafaxine XR (EFFEXOR-XR) 37.5 MG 24 hr capsule TAKE 1 CAPSULE BY MOUTH ONCE DAILY WITH  BREAKFAST   No facility-administered medications prior to visit.    Review of Systems  All other systems reviewed and are negative.     Objective    BP 125/66   Pulse 75   Resp 16   Ht 5' 4.5" (1.638 m)   Wt 205 lb (93 kg)   BMI 34.64 kg/m  BP Readings from Last 3 Encounters:  03/17/21 125/66  01/15/20 109/71  11/07/19 113/76   Wt Readings from Last 3 Encounters:  03/17/21 205 lb (93 kg)  01/15/20 200 lb (90.7 kg)  11/07/19 209 lb 3.2 oz (94.9 kg)      Physical Exam Vitals reviewed.  Constitutional:      General: She is not in acute distress.     Appearance: She is well-developed. She is obese.  HENT:     Head: Normocephalic and atraumatic.     Right Ear: Hearing, tympanic membrane and ear canal normal.     Left Ear: Hearing, tympanic  membrane and ear canal normal.     Nose: Nose normal.     Mouth/Throat:     Pharynx: Oropharynx is clear.  Eyes:     General: Lids are normal. No scleral icterus.       Right eye: No discharge.        Left eye: No discharge.     Conjunctiva/sclera: Conjunctivae normal.  Cardiovascular:     Rate and Rhythm: Normal rate and regular rhythm.     Heart sounds: Normal heart sounds.  Pulmonary:     Effort: Pulmonary effort is normal. No respiratory distress.     Breath sounds: Normal breath sounds. No wheezing, rhonchi or rales.  Abdominal:     Palpations: Abdomen is soft.  Genitourinary:    General: Normal vulva.     Comments: Normal bimanual exam.  DRE deferred as she had colonoscopy a year and a half ago. Musculoskeletal:        General: Normal range of motion.  Skin:    General: Skin is warm and dry.     Findings: No lesion or rash.     Comments: Small corn on left lateral foot just behind/inferior to the MTP joint of fifth toe.  Neurological:     General: No focal deficit present.     Mental Status: She is alert and oriented to person, place, and time.  Psychiatric:        Mood and Affect: Mood normal.        Speech: Speech normal.        Behavior: Behavior normal.        Thought Content: Thought content normal.        Judgment: Judgment normal.      Last depression screening scores PHQ 2/9 Scores 03/17/2021 11/07/2019 10/09/2017  PHQ - 2 Score 0 0 0  PHQ- 9 Score '1 1 2   '$ Last fall risk screening Fall Risk  11/07/2019  Falls in the past year? 0  Number falls in past yr: 0  Injury with Fall? 0  Follow up Falls evaluation completed   Last Audit-C alcohol use screening Alcohol Use Disorder Test (AUDIT) 11/07/2019  1. How often do you have a drink containing alcohol? 1  2. How many  drinks containing alcohol do you have on a typical day when you are drinking? 0  3. How often do you have six or more drinks on one occasion? 0  AUDIT-C Score 1   A score of 3 or more in women, and 4 or more in men indicates increased risk for alcohol abuse, EXCEPT if all of the points are from question 1   No results found for any visits on 03/17/21.  Assessment & Plan    Routine Health Maintenance and Physical Exam  Exercise Activities and Dietary recommendations  Goals   None     Immunization History  Administered Date(s) Administered   Hepatitis B 03/27/1997, 04/24/1997, 09/25/1997   Moderna Sars-Covid-2 Vaccination 11/02/2020   PFIZER(Purple Top)SARS-COV-2 Vaccination 09/28/2019, 10/23/2019   Td 01/25/2017   Tdap 11/16/2006   Zoster Recombinat (Shingrix) 02/03/2018, 05/31/2018    Health Maintenance  Topic Date Due   Pneumococcal Vaccine 56-38 Years old (1 - PCV) Never done   HIV Screening  Never done   PAP SMEAR-Modifier  10/01/2019   COVID-19 Vaccine (4 - Booster) 02/01/2021   INFLUENZA VACCINE  Never done   MAMMOGRAM  12/22/2021   COLONOSCOPY (Pts 45-27yr Insurance coverage will need to be confirmed)  04/28/2023  TETANUS/TDAP  01/26/2027   Hepatitis C Screening  Completed   Zoster Vaccines- Shingrix  Completed   HPV VACCINES  Aged Out    Discussed health benefits of physical activity, and encouraged her to engage in regular exercise appropriate for her age and condition.  1. Annual physical exam Woman care per GYN - CBC with Differential/Platelet - Comprehensive metabolic panel - Lipid panel - TSH  2. Anxiety  - venlafaxine XR (EFFEXOR-XR) 75 MG 24 hr capsule; Take 1 capsule (75 mg total) by mouth daily with breakfast.  Dispense: 30 capsule; Refill: 5  3. Hot flashes Increase venlafaxine from 37.5 to 75 mg - venlafaxine XR (EFFEXOR-XR) 75 MG 24 hr capsule; Take 1 capsule (75 mg total) by mouth daily with breakfast.  Dispense: 30 capsule; Refill:  5  4. Postmenopausal  - venlafaxine XR (EFFEXOR-XR) 75 MG 24 hr capsule; Take 1 capsule (75 mg total) by mouth daily with breakfast.  Dispense: 30 capsule; Refill: 5  5. Hypercholesteremia   6. Seasonal allergic rhinitis due to pollen   7. Recurrent major depressive disorder, in partial remission (HCC)   8. Class 1 obesity due to excess calories without serious comorbidity with body mass index (BMI) of 34.0 to 34.9 in adult    No follow-ups on file.     I, Wilhemena Durie, MD, have reviewed all documentation for this visit. The documentation on 03/20/21 for the exam, diagnosis, procedures, and orders are all accurate and complete.    Rosalie Gelpi Cranford Mon, MD  [facility] [phone] (phone) [phone] (fax)  [place]

## 2021-03-22 ENCOUNTER — Other Ambulatory Visit: Payer: Self-pay | Admitting: Family Medicine

## 2021-03-23 LAB — CBC WITH DIFFERENTIAL/PLATELET
Basophils Absolute: 0 10*3/uL (ref 0.0–0.2)
Basos: 0 %
EOS (ABSOLUTE): 0.2 10*3/uL (ref 0.0–0.4)
Eos: 3 %
Hematocrit: 43.7 % (ref 34.0–46.6)
Hemoglobin: 14.2 g/dL (ref 11.1–15.9)
Immature Grans (Abs): 0 10*3/uL (ref 0.0–0.1)
Immature Granulocytes: 0 %
Lymphocytes Absolute: 1.5 10*3/uL (ref 0.7–3.1)
Lymphs: 25 %
MCH: 29.3 pg (ref 26.6–33.0)
MCHC: 32.5 g/dL (ref 31.5–35.7)
MCV: 90 fL (ref 79–97)
Monocytes Absolute: 0.5 10*3/uL (ref 0.1–0.9)
Monocytes: 9 %
Neutrophils Absolute: 3.8 10*3/uL (ref 1.4–7.0)
Neutrophils: 63 %
Platelets: 340 10*3/uL (ref 150–450)
RBC: 4.84 x10E6/uL (ref 3.77–5.28)
RDW: 12.3 % (ref 11.7–15.4)
WBC: 6 10*3/uL (ref 3.4–10.8)

## 2021-03-23 LAB — LIPID PANEL
Chol/HDL Ratio: 3.1 ratio (ref 0.0–4.4)
Cholesterol, Total: 175 mg/dL (ref 100–199)
HDL: 56 mg/dL (ref >39)
LDL Chol Calc (NIH): 94 mg/dL (ref 0–99)
Triglycerides: 143 mg/dL (ref 0–149)
VLDL Cholesterol Cal: 25 mg/dL (ref 5–40)

## 2021-03-23 LAB — COMPREHENSIVE METABOLIC PANEL
ALT: 10 IU/L (ref 0–32)
AST: 14 IU/L (ref 0–40)
Albumin/Globulin Ratio: 2.2 (ref 1.2–2.2)
Albumin: 4.7 g/dL (ref 3.8–4.8)
Alkaline Phosphatase: 109 IU/L (ref 44–121)
BUN/Creatinine Ratio: 17 (ref 12–28)
BUN: 13 mg/dL (ref 8–27)
Bilirubin Total: 0.3 mg/dL (ref 0.0–1.2)
CO2: 20 mmol/L (ref 20–29)
Calcium: 9.7 mg/dL (ref 8.7–10.3)
Chloride: 105 mmol/L (ref 96–106)
Creatinine, Ser: 0.75 mg/dL (ref 0.57–1.00)
Globulin, Total: 2.1 g/dL (ref 1.5–4.5)
Glucose: 99 mg/dL (ref 65–99)
Potassium: 4.6 mmol/L (ref 3.5–5.2)
Sodium: 142 mmol/L (ref 134–144)
Total Protein: 6.8 g/dL (ref 6.0–8.5)
eGFR: 89 mL/min/{1.73_m2} (ref >59)

## 2021-03-23 LAB — TSH: TSH: 3.31 u[IU]/mL (ref 0.450–4.500)

## 2021-03-25 ENCOUNTER — Other Ambulatory Visit: Payer: Self-pay | Admitting: Family Medicine

## 2021-03-25 DIAGNOSIS — Z1231 Encounter for screening mammogram for malignant neoplasm of breast: Secondary | ICD-10-CM

## 2021-03-26 ENCOUNTER — Telehealth: Payer: Self-pay

## 2021-03-26 NOTE — Telephone Encounter (Signed)
Pt given lab results per notes of Dr. Rosanna Randy on 03/24/21. Pt verbalized understanding. She would like a copy of her results printed and she will pick up at the front office today.   Richard Maceo Pro., MD  03/24/2021 11:09 AM EDT     Labs stable. Please advise patient.

## 2021-04-19 ENCOUNTER — Other Ambulatory Visit: Payer: Self-pay

## 2021-04-19 ENCOUNTER — Ambulatory Visit
Admission: RE | Admit: 2021-04-19 | Discharge: 2021-04-19 | Disposition: A | Discharge status: Home / Self Care | Payer: BC Managed Care – PPO | Source: Ambulatory Visit | Attending: Family Medicine | Admitting: Family Medicine

## 2021-04-19 DIAGNOSIS — Z1231 Encounter for screening mammogram for malignant neoplasm of breast: Secondary | ICD-10-CM | POA: Diagnosis not present

## 2021-05-24 ENCOUNTER — Other Ambulatory Visit: Payer: Self-pay | Admitting: Family Medicine

## 2021-05-24 DIAGNOSIS — E7800 Pure hypercholesterolemia, unspecified: Secondary | ICD-10-CM

## 2021-05-25 NOTE — Telephone Encounter (Signed)
Requested Prescriptions  Pending Prescriptions Disp Refills  . rosuvastatin (CRESTOR) 5 MG tablet [Pharmacy Med Name: Rosuvastatin Calcium 5 MG Oral Tablet] 90 tablet 0    Sig: TAKE 1 TABLET BY MOUTH AT BEDTIME     Cardiovascular:  Antilipid - Statins Passed - 05/24/2021  9:45 PM      Passed - Total Cholesterol in normal range and within 360 days    Cholesterol, Total  Date Value Ref Range Status  03/22/2021 175 100 - 199 mg/dL Final         Passed - LDL in normal range and within 360 days    LDL Cholesterol (Calc)  Date Value Ref Range Status  03/31/2017 63 mg/dL (calc) Final    Comment:    Reference range: <100 . Desirable range <100 mg/dL for primary prevention;   <70 mg/dL for patients with CHD or diabetic patients  with > or = 2 CHD risk factors. Marland Kitchen LDL-C is now calculated using the Martin-Hopkins  calculation, which is a validated novel method providing  better accuracy than the Friedewald equation in the  estimation of LDL-C.  Cresenciano Genre et al. Annamaria Helling. 7681;157(26): 2061-2068  (http://education.[facility].com/faq/FAQ164)    LDL Chol Calc (NIH)  Date Value Ref Range Status  03/22/2021 94 0 - 99 mg/dL Final         Passed - HDL in normal range and within 360 days    HDL  Date Value Ref Range Status  03/22/2021 56 >39 mg/dL Final         Passed - Triglycerides in normal range and within 360 days    Triglycerides  Date Value Ref Range Status  03/22/2021 143 0 - 149 mg/dL Final         Passed - Patient is not pregnant      Passed - Valid encounter within last 12 months    Recent Outpatient Visits          2 months ago Annual physical exam   [facility] Jerrol Banana., MD   1 year ago Hot flashes   [facility] Jerrol Banana., MD   1 year ago Annual physical exam   [facility] Jerrol Banana., MD   1 year ago Corn of foot   [facility] Jerrol Banana., MD   2 years  ago URI with cough and congestion   [facility] [place], [place]ickki [place]uff, [place]      Future Appointments            In 2 months Jerrol Banana., MD [facility], [place]

## 2021-07-20 ENCOUNTER — Other Ambulatory Visit: Payer: Self-pay | Admitting: Family Medicine

## 2021-07-20 NOTE — Telephone Encounter (Signed)
Requested Prescriptions  Pending Prescriptions Disp Refills   SYNTHROID 50 MCG tablet [Pharmacy Med Name: Synthroid 50 MCG Oral Tablet] 90 tablet 2    Sig: Take 1 tablet by mouth once daily     Endocrinology:  Hypothyroid Agents Failed - 07/20/2021  7:09 PM      Failed - TSH needs to be rechecked within 3 months after an abnormal result. Refill until TSH is due.      Passed - TSH in normal range and within 360 days    TSH  Date Value Ref Range Status  03/22/2021 3.310 0.450 - 4.500 uIU/mL Final         Passed - Valid encounter within last 12 months    Recent Outpatient Visits          4 months ago Annual physical exam   [facility] Jerrol Banana., MD   1 year ago Hot flashes   [facility] Jerrol Banana., MD   1 year ago Annual physical exam   [facility] Jerrol Banana., MD   1 year ago Corn of foot   [facility] Jerrol Banana., MD   2 years ago URI with cough and congestion   [facility] [place], [place]ickki [place], [place]      Future Appointments            In 4 weeks Jerrol Banana., MD [facility], [place]

## 2021-08-17 ENCOUNTER — Ambulatory Visit: Payer: BC Managed Care – PPO | Admitting: Family Medicine

## 2021-08-17 ENCOUNTER — Encounter: Payer: Self-pay | Admitting: Family Medicine

## 2021-08-17 ENCOUNTER — Other Ambulatory Visit: Payer: Self-pay

## 2021-08-17 VITALS — BP 129/81 | HR 86 | Temp 98.0°F | Resp 16 | Ht 64.0 in | Wt 210.5 lb

## 2021-08-17 DIAGNOSIS — M25512 Pain in left shoulder: Secondary | ICD-10-CM | POA: Diagnosis not present

## 2021-08-17 DIAGNOSIS — F3341 Major depressive disorder, recurrent, in partial remission: Secondary | ICD-10-CM

## 2021-08-17 DIAGNOSIS — R232 Flushing: Secondary | ICD-10-CM | POA: Diagnosis not present

## 2021-08-17 DIAGNOSIS — F419 Anxiety disorder, unspecified: Secondary | ICD-10-CM

## 2021-08-17 DIAGNOSIS — Z780 Asymptomatic menopausal state: Secondary | ICD-10-CM | POA: Diagnosis not present

## 2021-08-17 DIAGNOSIS — E7800 Pure hypercholesterolemia, unspecified: Secondary | ICD-10-CM

## 2021-08-17 DIAGNOSIS — Z6834 Body mass index (BMI) 34.0-34.9, adult: Secondary | ICD-10-CM

## 2021-08-17 DIAGNOSIS — E6609 Other obesity due to excess calories: Secondary | ICD-10-CM

## 2021-08-17 DIAGNOSIS — E039 Hypothyroidism, unspecified: Secondary | ICD-10-CM

## 2021-08-17 NOTE — Progress Notes (Signed)
Established patient visit   Patient: Mikayla Munoz   DOB: [birth year]   66 y.o. Female  MRN: [id number] Visit Date: 08/17/2021  Today's healthcare provider: Wilhemena Durie, MD   Chief Complaint  Patient presents with   Shoulder Pain   Hyperlipidemia   Postmenopausal   Subjective    HPI  Patient comes in today for follow-up.  Flashes are better on slightly increased dose of Effexor.  She feels well.  She has no known trauma but has left shoulder pain with abduction which is bothering her a lot.  Mostly she is stable and has no complaints and wishes no changes.  She is trying to find a new gynecologist as the ones that have been here have left. Follow up for Hypercholesteremia  The patient was last seen for this 5 months ago. Changes made at last visit include; patient is taking rosuvastatin (CRESTOR) 5 MG tablet  She reports good compliance with treatment. She feels that condition is Unchanged. She is not having side effects. none  ----------------------------------------------------------------------------------------- Follow up for Postmenopausal and Hot flashes  The patient was last seen for this 5 months ago. Changes made at last visit include; Increase venlafaxine from 37.5 to 75 mg  She reports good compliance with treatment. She feels that condition is Improved. She is having side effects. Few nights of not sleeping  -----------------------------------------------------------------------------------------  Medications: Outpatient Medications Prior to Visit  Medication Sig   Biotin 10 MG CAPS Take 10 mg by mouth daily.   Brimonidine Tartrate (LUMIFY) 0.025 % SOLN Place 1 drop into both eyes daily as needed (for allergies).   Cholecalciferol (VITAMIN D) 2000 UNITS tablet Take 2,000 Units by mouth daily.    fluticasone (FLONASE) 50 MCG/ACT nasal spray Place 1-2 sprays into both nostrils daily as needed for allergies or rhinitis.   ibuprofen (ADVIL) 800 MG  tablet Take 1 tablet (800 mg total) by mouth every 8 (eight) hours as needed for mild pain or moderate pain.   loratadine (CLARITIN) 10 MG tablet Take 10 mg by mouth daily.    Lutein-Zeaxanthin 25-5 MG CAPS Take 1 capsule by mouth daily.    rosuvastatin (CRESTOR) 5 MG tablet TAKE 1 TABLET BY MOUTH AT BEDTIME   SYNTHROID 50 MCG tablet Take 1 tablet by mouth once daily   triamcinolone (NASACORT) 55 MCG/ACT AERO nasal inhaler Place into the nose.   valACYclovir (VALTREX) 500 MG tablet Take 1 tablet by mouth twice daily   venlafaxine XR (EFFEXOR-XR) 75 MG 24 hr capsule Take 1 capsule (75 mg total) by mouth daily with breakfast.   clobetasol cream (TEMOVATE) 0.05 % Apply topically as directed. Qd to bid aa rash on back until clear, avoid face, groin, axilla (Patient not taking: Reported on 08/17/2021)   FINACEA 15 % FOAM Apply A SMALL AMOUNT TO SKIN TWICE A DAY (Patient not taking: Reported on 08/17/2021)   scopolamine (TRANSDERM-SCOP) 1 MG/3DAYS Place 1 patch (1.5 mg total) onto the skin every 3 (three) days. (Patient not taking: Reported on 08/17/2021)   No facility-administered medications prior to visit.    Review of Systems      Objective    BP 129/81 (BP Location: Right Arm, Patient Position: Sitting, Cuff Size: Normal)    Pulse 86    Temp 98 F (36.7 C) (Temporal)    Resp 16    Ht 5\' 4"  (1.626 m)    Wt 210 lb 8 oz (95.5 kg)    SpO2 94%  BMI 36.13 kg/m  BP Readings from Last 3 Encounters:  08/17/21 129/81  03/17/21 125/66  01/15/20 109/71   Wt Readings from Last 3 Encounters:  08/17/21 210 lb 8 oz (95.5 kg)  03/17/21 205 lb (93 kg)  01/15/20 200 lb (90.7 kg)      Physical Exam Vitals reviewed.  Constitutional:      General: She is not in acute distress.    Appearance: She is well-developed.  HENT:     Head: Normocephalic and atraumatic.  Eyes:     General: No scleral icterus.    Conjunctiva/sclera: Conjunctivae normal.  Cardiovascular:     Rate and Rhythm: Normal rate and  regular rhythm.  Pulmonary:     Effort: Pulmonary effort is normal. No respiratory distress.  Abdominal:     Palpations: Abdomen is soft.  Musculoskeletal:     Right lower leg: No edema.     Left lower leg: No edema.     Comments: Discomfort with abduction of left shoulder.  Skin:    General: Skin is warm and dry.     Findings: No rash.  Neurological:     General: No focal deficit present.     Mental Status: She is alert and oriented to person, place, and time.  Psychiatric:        Mood and Affect: Mood normal.        Behavior: Behavior normal.        Thought Content: Thought content normal.        Judgment: Judgment normal.      No results found for any visits on 08/17/21.  Assessment & Plan     1. Hypercholesteremia Tolerating rosuvastatin 5.  2. Left shoulder pain, unspecified chronicity For to orthopedics this is a starting to bother her daily - AMB referral to orthopedics  3. Hot flashes Improved on venlafaxine  4. Postmenopausal Clinically stable, she wishes to find a gynecologist to care for her  5. Anxiety Stable on venlafaxine  6. Recurrent major depressive disorder, in partial remission ([place]) In remission  7. Adult hypothyroidism   8. Class 1 obesity due to excess calories without serious comorbidity with body mass index (BMI) of 34.0 to 34.9 in adult She has been working on diet and exercise.   Return in about 6 months (around 02/14/2022).      I, Wilhemena Durie, MD, have reviewed all documentation for this visit. The documentation on 08/19/21 for the exam, diagnosis, procedures, and orders are all accurate and complete.    Jediah Horger Cranford Mon, MD  [facility] [phone] (phone) [phone] (fax)  [place]

## 2021-08-24 ENCOUNTER — Other Ambulatory Visit: Payer: Self-pay | Admitting: Family Medicine

## 2021-08-24 DIAGNOSIS — E7800 Pure hypercholesterolemia, unspecified: Secondary | ICD-10-CM

## 2021-09-19 ENCOUNTER — Other Ambulatory Visit: Payer: Self-pay | Admitting: Family Medicine

## 2021-09-19 DIAGNOSIS — F419 Anxiety disorder, unspecified: Secondary | ICD-10-CM

## 2021-09-19 DIAGNOSIS — R232 Flushing: Secondary | ICD-10-CM

## 2021-09-19 DIAGNOSIS — Z780 Asymptomatic menopausal state: Secondary | ICD-10-CM

## 2021-10-25 ENCOUNTER — Other Ambulatory Visit: Payer: Self-pay | Admitting: Family Medicine

## 2021-10-25 DIAGNOSIS — Z780 Asymptomatic menopausal state: Secondary | ICD-10-CM

## 2021-10-25 DIAGNOSIS — R232 Flushing: Secondary | ICD-10-CM

## 2021-10-25 DIAGNOSIS — F419 Anxiety disorder, unspecified: Secondary | ICD-10-CM

## 2021-10-25 NOTE — Telephone Encounter (Signed)
Requested Prescriptions  ?Pending Prescriptions Disp Refills  ?? venlafaxine XR (EFFEXOR-XR) 75 MG 24 hr capsule [Pharmacy Med Name: Venlafaxine HCl ER 75 MG Oral Capsule Extended Release 24 Hour] 90 capsule 1  ?  Sig: TAKE 1 CAPSULE BY MOUTH ONCE DAILY WITH BREAKFAST  ?  ? Psychiatry: Antidepressants - SNRI - desvenlafaxine & venlafaxine Failed - 10/25/2021 10:04 AM  ?  ?  Failed - Lipid Panel in normal range within the last 12 months  ?  Cholesterol, Total  ?Date Value Ref Range Status  ?03/22/2021 175 100 - 199 mg/dL Final  ? ?LDL Cholesterol (Calc)  ?Date Value Ref Range Status  ?03/31/2017 63 mg/dL (calc) Final  ?  Comment:  ?  Reference range: <100 ?Marland Kitchen ?Desirable range <100 mg/dL for primary prevention;   ?<70 mg/dL for patients with CHD or diabetic patients  ?with > or = 2 CHD risk factors. ?. ?LDL-C is now calculated using the Martin-Hopkins  ?calculation, which is a validated novel method providing  ?better accuracy than the Friedewald equation in the  ?estimation of LDL-C.  ?Cresenciano Genre et al. Annamaria Helling. 5916;384(66): 2061-2068  ?(http://education.[employer].com/faq/FAQ164) ?  ? ?LDL Chol Calc (NIH)  ?Date Value Ref Range Status  ?03/22/2021 94 0 - 99 mg/dL Final  ? ?HDL  ?Date Value Ref Range Status  ?03/22/2021 56 >39 mg/dL Final  ? ?Triglycerides  ?Date Value Ref Range Status  ?03/22/2021 143 0 - 149 mg/dL Final  ? ?  ?  ?  Passed - Cr in normal range and within 360 days  ?  Creatinine, Ser  ?Date Value Ref Range Status  ?03/22/2021 0.75 0.57 - 1.00 mg/dL Final  ?   ?  ?  Passed - Completed PHQ-2 or PHQ-9 in the last 360 days  ?  ?  Passed - Last BP in normal range  ?  BP Readings from Last 1 Encounters:  ?08/17/21 129/81  ?   ?  ?  Passed - Valid encounter within last 6 months  ?  Recent Outpatient Visits   ?      ? 2 months ago Hypercholesteremia  ? [facility] Jerrol Banana., MD  ? 7 months ago Annual physical exam  ? [facility] Jerrol Banana., MD  ? 1 year  ago Hot flashes  ? [facility] Jerrol Banana., MD  ? 1 year ago Annual physical exam  ? [facility] Jerrol Banana., MD  ? 2 years ago Corn of foot  ? [facility] Jerrol Banana., MD  ?  ?  ?Future Appointments   ?        ? In 4 months Jerrol Banana., MD [facility]  ?  ? ?  ?  ?  ? ?

## 2021-12-16 ENCOUNTER — Ambulatory Visit: Payer: BC Managed Care – PPO | Admitting: Family Medicine

## 2021-12-16 ENCOUNTER — Encounter: Payer: Self-pay | Admitting: Family Medicine

## 2021-12-16 VITALS — BP 103/66 | HR 93 | Temp 98.2°F | Resp 14 | Wt 205.0 lb

## 2021-12-16 DIAGNOSIS — W57XXXD Bitten or stung by nonvenomous insect and other nonvenomous arthropods, subsequent encounter: Secondary | ICD-10-CM | POA: Diagnosis not present

## 2021-12-16 DIAGNOSIS — R5381 Other malaise: Secondary | ICD-10-CM | POA: Diagnosis not present

## 2021-12-16 DIAGNOSIS — W57XXXA Bitten or stung by nonvenomous insect and other nonvenomous arthropods, initial encounter: Secondary | ICD-10-CM | POA: Insufficient documentation

## 2021-12-16 DIAGNOSIS — R1114 Bilious vomiting: Secondary | ICD-10-CM | POA: Diagnosis not present

## 2021-12-16 DIAGNOSIS — S20462D Insect bite (nonvenomous) of left back wall of thorax, subsequent encounter: Secondary | ICD-10-CM | POA: Diagnosis not present

## 2021-12-16 DIAGNOSIS — R5383 Other fatigue: Secondary | ICD-10-CM

## 2021-12-16 NOTE — Assessment & Plan Note (Signed)
Acute, successfully removed  Localized erythema at site Will check for RMSF and Lyme Denies fevers Encouraged ER if fevers

## 2021-12-16 NOTE — Assessment & Plan Note (Signed)
Acute x 10 days With associated tick bite and vomiting/nausea Unknown cause Suspect viral Will do basic blood work to r/u infection and electrolyte abnormalities Exam is currently benign RTC if symptoms change

## 2021-12-16 NOTE — Progress Notes (Signed)
I,Roshena L Chambers,acting as a scribe for Gwyneth Sprout, FNP.,have documented all relevant documentation on the behalf of Gwyneth Sprout, FNP,as directed by  Gwyneth Sprout, FNP while in the presence of Gwyneth Sprout, FNP.   Established patient visit   Patient: Mikayla Munoz   DOB: [birth year]   66 y.o. Female  MRN: [id number] Visit Date: 12/16/2021  Today's healthcare provider: Gwyneth Sprout, FNP  Introduced to nurse practitioner role and practice setting.  All questions answered.  Discussed provider/patient relationship and expectations.   Chief Complaint  Patient presents with   Nausea   Subjective    HPI  Nausea: Patient complains of nausea, vomiting and weakness for the past 10 days. She also has itchy skin and chills. Patient pulled a tick off her shoulder 8 days ago.   Medications: Outpatient Medications Prior to Visit  Medication Sig   Biotin 10 MG CAPS Take 10 mg by mouth daily.   Brimonidine Tartrate (LUMIFY) 0.025 % SOLN Place 1 drop into both eyes daily as needed (for allergies).   Cholecalciferol (VITAMIN D) 2000 UNITS tablet Take 2,000 Units by mouth daily.    clobetasol cream (TEMOVATE) 0.05 % Apply topically as directed. Qd to bid aa rash on back until clear, avoid face, groin, axilla   FINACEA 15 % FOAM    fluticasone (FLONASE) 50 MCG/ACT nasal spray Place 1-2 sprays into both nostrils daily as needed for allergies or rhinitis.   ibuprofen (ADVIL) 800 MG tablet Take 1 tablet (800 mg total) by mouth every 8 (eight) hours as needed for mild pain or moderate pain.   loratadine (CLARITIN) 10 MG tablet Take 10 mg by mouth daily.    Lutein-Zeaxanthin 25-5 MG CAPS Take 1 capsule by mouth daily.    rosuvastatin (CRESTOR) 5 MG tablet TAKE 1 TABLET BY MOUTH AT BEDTIME   scopolamine (TRANSDERM-SCOP) 1 MG/3DAYS Place 1 patch (1.5 mg total) onto the skin every 3 (three) days.   SYNTHROID 50 MCG tablet Take 1 tablet by mouth once daily   triamcinolone (NASACORT) 55 MCG/ACT  AERO nasal inhaler Place into the nose.   valACYclovir (VALTREX) 500 MG tablet Take 1 tablet by mouth twice daily   venlafaxine XR (EFFEXOR-XR) 75 MG 24 hr capsule TAKE 1 CAPSULE BY MOUTH ONCE DAILY WITH BREAKFAST   No facility-administered medications prior to visit.    Review of Systems  Constitutional:  Positive for fatigue. Negative for appetite change, chills and fever.  Respiratory:  Negative for chest tightness and shortness of breath.   Cardiovascular:  Negative for chest pain and palpitations.  Gastrointestinal:  Positive for abdominal pain, nausea and vomiting.  Neurological:  Positive for weakness. Negative for dizziness.       Objective    BP 103/66 (BP Location: Right Arm, Patient Position: Sitting, Cuff Size: Large)   Pulse 93   Temp 98.2 F (36.8 C) (Oral)   Resp 14   Wt 205 lb (93 kg)   BMI 35.19 kg/m    Physical Exam Vitals and nursing note reviewed.  Constitutional:      General: She is not in acute distress.    Appearance: Normal appearance. She is obese. She is not ill-appearing, toxic-appearing or diaphoretic.  HENT:     Head: Normocephalic and atraumatic.  Cardiovascular:     Rate and Rhythm: Normal rate and regular rhythm.     Pulses: Normal pulses.     Heart sounds: Normal heart sounds. No murmur heard.  No friction rub. No gallop.  Pulmonary:     Effort: Pulmonary effort is normal. No respiratory distress.     Breath sounds: Normal breath sounds. No stridor. No wheezing, rhonchi or rales.  Chest:     Chest wall: No tenderness.  Abdominal:     General: Bowel sounds are normal.     Palpations: Abdomen is soft.  Musculoskeletal:        General: No swelling, tenderness, deformity or signs of injury. Normal range of motion.     Right lower leg: No edema.     Left lower leg: No edema.  Skin:    General: Skin is warm and dry.     Capillary Refill: Capillary refill takes less than 2 seconds.     Coloration: Skin is not jaundiced or pale.      Findings: Erythema present. No abscess, bruising, ecchymosis, signs of injury, lesion, rash or wound. Rash is not crusting, macular, nodular, papular, purpuric, pustular, scaling, urticarial or vesicular.          Comments: Skin remains slightly red at site of bite; no traditional bulls eye, no warmth, no edema, no temperature changes. No retained material. Rash is localized.   Neurological:     General: No focal deficit present.     Mental Status: She is alert and oriented to person, place, and time. Mental status is at baseline.     Cranial Nerves: No cranial nerve deficit.     Sensory: No sensory deficit.     Motor: No weakness.     Coordination: Coordination normal.  Psychiatric:        Mood and Affect: Mood normal.        Behavior: Behavior normal.        Thought Content: Thought content normal.        Judgment: Judgment normal.       No results found for any visits on 12/16/21.  Assessment & Plan     Problem List Items Addressed This Visit       Digestive   Bilious vomiting with nausea    Acute, x 48 hrs Now resolved Denies sick contacts or travel  Has been under undue stress at work and at home       Relevant Orders   Comprehensive Metabolic Panel (CMET)   CBC with Differential/Platelet     Musculoskeletal and Integument   Tick bite of left back wall of thorax - Primary    Acute, successfully removed  Localized erythema at site Will check for RMSF and Lyme Denies fevers Encouraged ER if fevers      Relevant Orders   Lyme Disease Serology w/Reflex   Lyme disease dna by pcr(borrelia burg)   Rocky mtn spotted fvr abs pnl(IgG+IgM)     Other   Malaise and fatigue    Acute x 10 days With associated tick bite and vomiting/nausea Unknown cause Suspect viral Will do basic blood work to r/u infection and electrolyte abnormalities Exam is currently benign RTC if symptoms change       Relevant Orders   Comprehensive Metabolic Panel (CMET)   CBC with  Differential/Platelet      Return if symptoms worsen or fail to improve.      Vonna Kotyk, FNP, have reviewed all documentation for this visit. The documentation on 12/16/21 for the exam, diagnosis, procedures, and orders are all accurate and complete.    Gwyneth Sprout, [place] [phone] (phone) [phone] (fax)   [facility]  

## 2021-12-16 NOTE — Assessment & Plan Note (Signed)
Acute, x 48 hrs Now resolved Denies sick contacts or travel  Has been under undue stress at work and at home

## 2021-12-17 NOTE — Progress Notes (Signed)
Blood chemistry is stable; no electrolyte abnormalities which is reassuring given your course of illness. Slight increase in blood sugar.  Normal cell counts; no infection or anemia.  I will follow up on tick borne disease results.  Continue supportive care measures and seek emergent care if you spike a fever >99.  Please let us know if you have any questions.  Thank you, Gwyneth Sprout, [address] [place], [place] [address] [phone] (phone) [phone] (fax) [place]

## 2021-12-22 ENCOUNTER — Other Ambulatory Visit: Payer: Self-pay | Admitting: Family Medicine

## 2021-12-22 DIAGNOSIS — A770 Spotted fever due to Rickettsia rickettsii: Secondary | ICD-10-CM

## 2021-12-22 MED ORDER — DOXYCYCLINE HYCLATE 100 MG PO TABS: ORAL_TABLET | ORAL | 0 refills | Status: DC

## 2021-12-22 NOTE — Progress Notes (Signed)
Call patient:  RMSF is positive, indicating current symptoms and recent exposure. Lyme negative. Abx sent.

## 2021-12-23 LAB — CBC WITH DIFFERENTIAL/PLATELET
Basophils Absolute: 0 10*3/uL (ref 0.0–0.2)
Basos: 0 %
EOS (ABSOLUTE): 0.1 10*3/uL (ref 0.0–0.4)
Eos: 1 %
Hematocrit: 42.1 % (ref 34.0–46.6)
Hemoglobin: 14.3 g/dL (ref 11.1–15.9)
Immature Grans (Abs): 0 10*3/uL (ref 0.0–0.1)
Immature Granulocytes: 0 %
Lymphocytes Absolute: 1.5 10*3/uL (ref 0.7–3.1)
Lymphs: 17 %
MCH: 29.9 pg (ref 26.6–33.0)
MCHC: 34 g/dL (ref 31.5–35.7)
MCV: 88 fL (ref 79–97)
Monocytes Absolute: 0.8 10*3/uL (ref 0.1–0.9)
Monocytes: 9 %
Neutrophils Absolute: 6.5 10*3/uL (ref 1.4–7.0)
Neutrophils: 73 %
Platelets: 359 10*3/uL (ref 150–450)
RBC: 4.78 x10E6/uL (ref 3.77–5.28)
RDW: 11.9 % (ref 11.7–15.4)
WBC: 8.9 10*3/uL (ref 3.4–10.8)

## 2021-12-23 LAB — COMPREHENSIVE METABOLIC PANEL
ALT: 17 IU/L (ref 0–32)
AST: 21 IU/L (ref 0–40)
Albumin/Globulin Ratio: 1.8 (ref 1.2–2.2)
Albumin: 4.8 g/dL (ref 3.8–4.8)
Alkaline Phosphatase: 100 IU/L (ref 44–121)
BUN/Creatinine Ratio: 14 (ref 12–28)
BUN: 11 mg/dL (ref 8–27)
Bilirubin Total: 0.5 mg/dL (ref 0.0–1.2)
CO2: 23 mmol/L (ref 20–29)
Calcium: 9.9 mg/dL (ref 8.7–10.3)
Chloride: 100 mmol/L (ref 96–106)
Creatinine, Ser: 0.78 mg/dL (ref 0.57–1.00)
Globulin, Total: 2.6 g/dL (ref 1.5–4.5)
Glucose: 113 mg/dL — ABNORMAL HIGH (ref 70–99)
Potassium: 4 mmol/L (ref 3.5–5.2)
Sodium: 138 mmol/L (ref 134–144)
Total Protein: 7.4 g/dL (ref 6.0–8.5)
eGFR: 85 mL/min/{1.73_m2} (ref >59)

## 2021-12-23 LAB — RMSF, IGG, IFA: RMSF, IGG, IFA: 1:64 {titer} — ABNORMAL HIGH

## 2021-12-23 LAB — LYME DISEASE SEROLOGY W/REFLEX: Lyme Total Antibody EIA: NEGATIVE

## 2021-12-23 LAB — LYME DISEASE DNA BY PCR(BORRELIA BURG): Lyme (B. burgdorferi) PCR: NEGATIVE

## 2021-12-23 LAB — ROCKY MTN SPOTTED FVR ABS PNL(IGG+IGM)
RMSF IgG: POSITIVE — AB
RMSF IgM: 0.64 index (ref 0.00–0.89)

## 2022-03-21 ENCOUNTER — Ambulatory Visit (INDEPENDENT_AMBULATORY_CARE_PROVIDER_SITE_OTHER): Payer: Medicare Other | Admitting: Family Medicine

## 2022-03-21 ENCOUNTER — Encounter: Payer: Self-pay | Admitting: Family Medicine

## 2022-03-21 VITALS — BP 130/83 | HR 85 | Resp 16 | Wt 210.0 lb

## 2022-03-21 DIAGNOSIS — E039 Hypothyroidism, unspecified: Secondary | ICD-10-CM | POA: Diagnosis not present

## 2022-03-21 DIAGNOSIS — R1114 Bilious vomiting: Secondary | ICD-10-CM

## 2022-03-21 DIAGNOSIS — Z780 Asymptomatic menopausal state: Secondary | ICD-10-CM

## 2022-03-21 DIAGNOSIS — R519 Headache, unspecified: Secondary | ICD-10-CM

## 2022-03-21 DIAGNOSIS — E7800 Pure hypercholesterolemia, unspecified: Secondary | ICD-10-CM

## 2022-03-21 DIAGNOSIS — K219 Gastro-esophageal reflux disease without esophagitis: Secondary | ICD-10-CM

## 2022-03-21 DIAGNOSIS — F3341 Major depressive disorder, recurrent, in partial remission: Secondary | ICD-10-CM

## 2022-03-21 DIAGNOSIS — G8929 Other chronic pain: Secondary | ICD-10-CM

## 2022-03-21 DIAGNOSIS — Z1231 Encounter for screening mammogram for malignant neoplasm of breast: Secondary | ICD-10-CM

## 2022-03-21 MED ORDER — OMEPRAZOLE 40 MG PO CPDR: 40.0000 mg | DELAYED_RELEASE_CAPSULE | Freq: Every day | ORAL | 3 refills | Status: AC

## 2022-03-21 NOTE — Progress Notes (Signed)
Established patient visit  I,April Miller,acting as a scribe for Wilhemena Durie, MD.,have documented all relevant documentation on the behalf of Wilhemena Durie, MD,as directed by  Wilhemena Durie, MD while in the presence of Wilhemena Durie, MD.   Patient: Mikayla Munoz   DOB: [birth year]   66 y.o. Female  MRN: [id number] Visit Date: 03/21/2022  Today's healthcare provider: Wilhemena Durie, MD   Chief Complaint  Patient presents with   Follow-up   Subjective    HPI  Patient is following up for positive RMSF.  She had RMSF in June followed by COVID in July and in August she has had some nausea and a mild headache for 1 or 2 days and occasional nausea since then.  She states she is feeling better recently but is worried about the nausea.  It is not related to eating. No systemic symptoms and no weight loss.  No dysphagia. Medications: Outpatient Medications Prior to Visit  Medication Sig   Biotin 10 MG CAPS Take 10 mg by mouth daily.   Brimonidine Tartrate (LUMIFY) 0.025 % SOLN Place 1 drop into both eyes daily as needed (for allergies).   Cholecalciferol (VITAMIN D) 2000 UNITS tablet Take 2,000 Units by mouth daily.    doxycycline (VIBRA-TABS) 100 MG tablet Loading dose of 200 mg once, then 100 mg PO twice daily for 7 days.   fluticasone (FLONASE) 50 MCG/ACT nasal spray Place 1-2 sprays into both nostrils daily as needed for allergies or rhinitis.   ibuprofen (ADVIL) 800 MG tablet Take 1 tablet (800 mg total) by mouth every 8 (eight) hours as needed for mild pain or moderate pain.   loratadine (CLARITIN) 10 MG tablet Take 10 mg by mouth daily.    Lutein-Zeaxanthin 25-5 MG CAPS Take 1 capsule by mouth daily.    rosuvastatin (CRESTOR) 5 MG tablet TAKE 1 TABLET BY MOUTH AT BEDTIME   scopolamine (TRANSDERM-SCOP) 1 MG/3DAYS Place 1 patch (1.5 mg total) onto the skin every 3 (three) days.   SYNTHROID 50 MCG tablet Take 1 tablet by mouth once daily   triamcinolone  (NASACORT) 55 MCG/ACT AERO nasal inhaler Place into the nose.   valACYclovir (VALTREX) 500 MG tablet Take 1 tablet by mouth twice daily   venlafaxine XR (EFFEXOR-XR) 75 MG 24 hr capsule TAKE 1 CAPSULE BY MOUTH ONCE DAILY WITH BREAKFAST   clobetasol cream (TEMOVATE) 0.05 % Apply topically as directed. Qd to bid aa rash on back until clear, avoid face, groin, axilla (Patient not taking: Reported on 03/21/2022)   [DISCONTINUED] FINACEA 15 % FOAM  (Patient not taking: Reported on 03/21/2022)   No facility-administered medications prior to visit.    Review of Systems  Last CBC Lab Results  Component Value Date   WBC 6.1 03/22/2022   HGB 13.7 03/22/2022   HCT 41.3 03/22/2022   MCV 88 03/22/2022   MCH 29.3 03/22/2022   RDW 12.5 03/22/2022   PLT 313 03/22/2022       Objective    BP 130/83 (BP Location: Left Arm, Patient Position: Sitting, Cuff Size: Large)   Pulse 85   Resp 16   Wt 210 lb (95.3 kg)   SpO2 98%   BMI 36.05 kg/m  BP Readings from Last 3 Encounters:  03/21/22 130/83  12/16/21 103/66  08/17/21 129/81   Wt Readings from Last 3 Encounters:  03/21/22 210 lb (95.3 kg)  12/16/21 205 lb (93 kg)  08/17/21 210 lb 8 oz (  95.5 kg)      Physical Exam Vitals reviewed.  Constitutional:      General: She is not in acute distress.    Appearance: She is well-developed.  HENT:     Head: Normocephalic and atraumatic.     Right Ear: Hearing normal.     Left Ear: Hearing normal.     Nose: Nose normal.  Eyes:     General: Lids are normal. No scleral icterus.       Right eye: No discharge.        Left eye: No discharge.     Conjunctiva/sclera: Conjunctivae normal.  Cardiovascular:     Rate and Rhythm: Normal rate and regular rhythm.     Heart sounds: Normal heart sounds.  Pulmonary:     Effort: Pulmonary effort is normal. No respiratory distress.  Abdominal:     Palpations: Abdomen is soft.  Skin:    General: Skin is warm and dry.     Findings: No lesion or rash.   Neurological:     General: No focal deficit present.     Mental Status: She is alert and oriented to person, place, and time.  Psychiatric:        Mood and Affect: Mood normal.        Speech: Speech normal.        Behavior: Behavior normal.        Thought Content: Thought content normal.        Judgment: Judgment normal.       No results found for any visits on 03/21/22.  Assessment & Plan    1. Encounter for screening mammogram for malignant neoplasm of breast  - CBC w/Diff/Platelet - Comprehensive Metabolic Panel (CMET) - TSH - Lipid panel - MM 3D SCREEN BREAST BILATERAL  2. Hypercholesteremia On rosuvastatin - CBC w/Diff/Platelet - Comprehensive Metabolic Panel (CMET) - TSH - Lipid panel  3. Postmenopausal Better with venlafaxine symptoms - CBC w/Diff/Platelet - Comprehensive Metabolic Panel (CMET) - TSH - Lipid panel  4. Adult hypothyroidism Treat for euthyroid TSH - CBC w/Diff/Platelet - Comprehensive Metabolic Panel (CMET) - TSH - Lipid panel  5. Recurrent major depressive disorder, in partial remission (HCC) On venlafaxine - CBC w/Diff/Platelet - Comprehensive Metabolic Panel (CMET) - TSH - Lipid panel  6. Bilious vomiting with nausea Vomiting has resolved but she is still nauseated.  Treat with omeprazole 40 and refer to GI. - CBC w/Diff/Platelet - Comprehensive Metabolic Panel (CMET) - TSH - Lipid panel - Ambulatory referral to Gastroenterology  7. Chronic nonintractable headache, unspecified headache type Is much improved and almost resolved - CBC w/Diff/Platelet - Comprehensive Metabolic Panel (CMET) - TSH - Lipid panel - Sed Rate (ESR)  8. Gastroesophageal reflux disease, unspecified whether esophagitis present The symptoms seem to be lingering. - CBC w/Diff/Platelet - Comprehensive Metabolic Panel (CMET) - TSH - Lipid panel - Ambulatory referral to Gastroenterology - omeprazole (PRILOSEC) 40 MG capsule; Take 1 capsule (40 mg  total) by mouth daily.  Dispense: 90 capsule; Refill: 3    No follow-ups on file.      I, Wilhemena Durie, MD, have reviewed all documentation for this visit. The documentation on 03/24/22 for the exam, diagnosis, procedures, and orders are all accurate and complete.    Britain Saber Cranford Mon, MD  [facility] [phone] (phone) [phone] (fax)  [place]

## 2022-03-23 ENCOUNTER — Other Ambulatory Visit: Payer: Self-pay | Admitting: Obstetrics and Gynecology

## 2022-03-23 ENCOUNTER — Other Ambulatory Visit: Payer: Self-pay | Admitting: Family Medicine

## 2022-03-23 DIAGNOSIS — Z1231 Encounter for screening mammogram for malignant neoplasm of breast: Secondary | ICD-10-CM

## 2022-03-23 LAB — COMPREHENSIVE METABOLIC PANEL
ALT: 19 IU/L (ref 0–32)
AST: 18 IU/L (ref 0–40)
Albumin/Globulin Ratio: 1.9 (ref 1.2–2.2)
Albumin: 4.5 g/dL (ref 3.9–4.9)
Alkaline Phosphatase: 103 IU/L (ref 44–121)
BUN/Creatinine Ratio: 16 (ref 12–28)
BUN: 12 mg/dL (ref 8–27)
Bilirubin Total: 0.4 mg/dL (ref 0.0–1.2)
CO2: 21 mmol/L (ref 20–29)
Calcium: 9.7 mg/dL (ref 8.7–10.3)
Chloride: 103 mmol/L (ref 96–106)
Creatinine, Ser: 0.73 mg/dL (ref 0.57–1.00)
Globulin, Total: 2.4 g/dL (ref 1.5–4.5)
Glucose: 99 mg/dL (ref 70–99)
Potassium: 4.1 mmol/L (ref 3.5–5.2)
Sodium: 140 mmol/L (ref 134–144)
Total Protein: 6.9 g/dL (ref 6.0–8.5)
eGFR: 91 mL/min/{1.73_m2} (ref >59)

## 2022-03-23 LAB — CBC WITH DIFFERENTIAL/PLATELET
Basophils Absolute: 0 10*3/uL (ref 0.0–0.2)
Basos: 0 %
EOS (ABSOLUTE): 0.2 10*3/uL (ref 0.0–0.4)
Eos: 3 %
Hematocrit: 41.3 % (ref 34.0–46.6)
Hemoglobin: 13.7 g/dL (ref 11.1–15.9)
Immature Grans (Abs): 0 10*3/uL (ref 0.0–0.1)
Immature Granulocytes: 0 %
Lymphocytes Absolute: 1.5 10*3/uL (ref 0.7–3.1)
Lymphs: 24 %
MCH: 29.3 pg (ref 26.6–33.0)
MCHC: 33.2 g/dL (ref 31.5–35.7)
MCV: 88 fL (ref 79–97)
Monocytes Absolute: 0.6 10*3/uL (ref 0.1–0.9)
Monocytes: 9 %
Neutrophils Absolute: 3.8 10*3/uL (ref 1.4–7.0)
Neutrophils: 64 %
Platelets: 313 10*3/uL (ref 150–450)
RBC: 4.67 x10E6/uL (ref 3.77–5.28)
RDW: 12.5 % (ref 11.7–15.4)
WBC: 6.1 10*3/uL (ref 3.4–10.8)

## 2022-03-23 LAB — LIPID PANEL
Chol/HDL Ratio: 3 ratio (ref 0.0–4.4)
Cholesterol, Total: 178 mg/dL (ref 100–199)
HDL: 60 mg/dL (ref >39)
LDL Chol Calc (NIH): 98 mg/dL (ref 0–99)
Triglycerides: 114 mg/dL (ref 0–149)
VLDL Cholesterol Cal: 20 mg/dL (ref 5–40)

## 2022-03-23 LAB — SEDIMENTATION RATE: Sed Rate: 7 mm/hr (ref 0–40)

## 2022-03-23 LAB — TSH: TSH: 3.52 u[IU]/mL (ref 0.450–4.500)

## 2022-04-11 ENCOUNTER — Other Ambulatory Visit: Payer: Self-pay | Admitting: Family Medicine

## 2022-04-11 DIAGNOSIS — Z780 Asymptomatic menopausal state: Secondary | ICD-10-CM

## 2022-04-11 DIAGNOSIS — R232 Flushing: Secondary | ICD-10-CM

## 2022-04-11 DIAGNOSIS — F419 Anxiety disorder, unspecified: Secondary | ICD-10-CM

## 2022-04-26 ENCOUNTER — Ambulatory Visit
Admission: RE | Admit: 2022-04-26 | Discharge: 2022-04-26 | Disposition: A | Discharge status: Home / Self Care | Payer: Medicare Other | Source: Ambulatory Visit | Attending: Obstetrics and Gynecology | Admitting: Obstetrics and Gynecology

## 2022-04-26 DIAGNOSIS — Z1231 Encounter for screening mammogram for malignant neoplasm of breast: Secondary | ICD-10-CM | POA: Insufficient documentation

## 2022-04-29 ENCOUNTER — Telehealth: Payer: Self-pay

## 2022-04-29 NOTE — Telephone Encounter (Signed)
Patient called to inquire about mammogram results. Results given, patient verbal understanding.

## 2022-07-19 NOTE — Progress Notes (Unsigned)
I,Mikayla Munoz,acting as a scribe for [employer], PA-C.,have documented all relevant documentation on the behalf of Mikayla Kirschner, PA-C,as directed by  Mikayla Kirschner, PA-C while in the presence of Mikayla Kirschner, PA-C.   Established patient visit   Patient: Mikayla Munoz   DOB: 08-09-1955   67 y.o. Female  MRN: [id number] Visit Date: 07/20/2022  Today's healthcare provider: [name], PA-C   Chief Complaint  Patient presents with   Cough   Subjective     Pt reports a cough cough x 2-3 weeks after a brief sickness that has lingered. She reports nausea and diarrhea every 30 minutes - 1-2 hours since 1/5. She was recently on a [place]  Denies blood in stool, vomiting, fever, chills. Denies nasal congestion, PND, SOB.  She has taken mucinex DM and immodium for her symptoms with limited relief.  Medications: Outpatient Medications Prior to Visit  Medication Sig   Biotin 10 MG CAPS Take 10 mg by mouth daily.   Brimonidine Tartrate (LUMIFY) 0.025 % SOLN Place 1 drop into both eyes daily as needed (for allergies).   Cholecalciferol (VITAMIN D) 2000 UNITS tablet Take 2,000 Units by mouth daily.    clobetasol cream (TEMOVATE) 0.05 % Apply topically as directed. Qd to bid aa rash on back until clear, avoid face, groin, axilla   estradiol (ESTRACE) 0.1 MG/GM vaginal cream Insert fingertip unit amount vaginally nightly x 2 weeks, then every other night x 2 weeks and then twice weekly thereafter   fluticasone (FLONASE) 50 MCG/ACT nasal spray Place 1-2 sprays into both nostrils daily as needed for allergies or rhinitis.   ibuprofen (ADVIL) 800 MG tablet Take 1 tablet (800 mg total) by mouth every 8 (eight) hours as needed for mild pain or moderate pain.   loratadine (CLARITIN) 10 MG tablet Take 10 mg by mouth daily.    Lutein-Zeaxanthin 25-5 MG CAPS Take 1 capsule by mouth daily.    omeprazole (PRILOSEC) 40 MG capsule Take 1 capsule  (40 mg total) by mouth daily.   rosuvastatin (CRESTOR) 5 MG tablet TAKE 1 TABLET BY MOUTH AT BEDTIME   scopolamine (TRANSDERM-SCOP) 1 MG/3DAYS Place 1 patch (1.5 mg total) onto the skin every 3 (three) days.   SYNTHROID 50 MCG tablet Take 1 tablet by mouth once daily   triamcinolone (NASACORT) 55 MCG/ACT AERO nasal inhaler Place into the nose.   valACYclovir (VALTREX) 500 MG tablet Take 1 tablet by mouth twice daily   venlafaxine XR (EFFEXOR-XR) 75 MG 24 hr capsule TAKE 1 CAPSULE BY MOUTH ONCE DAILY WITH BREAKFAST   [DISCONTINUED] doxycycline (VIBRA-TABS) 100 MG tablet Loading dose of 200 mg once, then 100 mg PO twice daily for 7 days.   No facility-administered medications prior to visit.    Review of Systems  Constitutional:  Negative for fever.  HENT:  Negative for postnasal drip, rhinorrhea and sore throat.   Respiratory:  Positive for cough. Negative for shortness of breath and wheezing.      Objective    BP 131/70 (BP Location: Left Arm, Patient Position: Sitting, Cuff Size: Large)   Pulse 71   Temp 97.8 F (36.6 C) (Oral)   Resp 16   Wt 209 lb (94.8 kg)   BMI 35.87 kg/m    Physical Exam Constitutional:      General: She is awake.     Appearance: She is well-developed.  HENT:     Head: Normocephalic.  Eyes:     Conjunctiva/sclera: Conjunctivae normal.  Cardiovascular:     Rate and Rhythm: Normal rate and regular rhythm.     Heart sounds: Normal heart sounds.  Pulmonary:     Effort: Pulmonary effort is normal.     Breath sounds: Normal breath sounds. No wheezing or rales.  Abdominal:     General: There is no distension.     Palpations: Abdomen is soft.     Tenderness: There is no abdominal tenderness. There is no guarding.  Skin:    General: Skin is warm.  Neurological:     Mental Status: She is alert and oriented to person, place, and time.  Psychiatric:        Attention and Perception: Attention normal.        Mood and Affect: Mood normal.        Speech:  Speech normal.        Behavior: Behavior is cooperative.     No results found for any visits on 07/20/22.  Assessment & Plan     Post viral cough Advised to continue antihistamines, honey, warm liquids.   2. Diarrhea Increase fluids Given travel/cruise will treat as travelers diarrhea, azithromycin 500 mg x 3 days After abx treatment if symptoms persist recommend pepto bismol  Return if symptoms worsen or fail to improve.     I, Mikayla Kirschner, PA-C have reviewed all documentation for this visit. The documentation on  07/20/22  for the exam, diagnosis, procedures, and orders are all accurate and complete.  Mikayla Kirschner, PA-C [facility] [address] [place], [place], [address] Office: [phone] Fax: [place]

## 2022-07-20 ENCOUNTER — Encounter: Payer: Self-pay | Admitting: Physician Assistant

## 2022-07-20 ENCOUNTER — Ambulatory Visit: Payer: Medicare PPO | Admitting: Physician Assistant

## 2022-07-20 VITALS — BP 131/70 | HR 71 | Temp 97.8°F | Resp 16 | Wt 209.0 lb

## 2022-07-20 DIAGNOSIS — A09 Infectious gastroenteritis and colitis, unspecified: Secondary | ICD-10-CM | POA: Diagnosis not present

## 2022-07-20 DIAGNOSIS — R058 Other specified cough: Secondary | ICD-10-CM

## 2022-07-20 MED ORDER — AZITHROMYCIN 500 MG PO TABS: 500.0000 mg | ORAL_TABLET | Freq: Every day | ORAL | 0 refills | Status: AC

## 2022-08-19 ENCOUNTER — Other Ambulatory Visit: Payer: Self-pay | Admitting: Family Medicine

## 2022-08-19 DIAGNOSIS — F419 Anxiety disorder, unspecified: Secondary | ICD-10-CM

## 2022-08-19 DIAGNOSIS — Z780 Asymptomatic menopausal state: Secondary | ICD-10-CM

## 2022-08-19 DIAGNOSIS — R232 Flushing: Secondary | ICD-10-CM

## 2022-08-22 NOTE — Telephone Encounter (Signed)
Requested Prescriptions  Pending Prescriptions Disp Refills   valACYclovir (VALTREX) 500 MG tablet [Pharmacy Med Name: valACYclovir HCl 500 MG Oral Tablet] 180 tablet 0    Sig: Take 1 tablet by mouth twice daily     Antimicrobials:  Antiviral Agents - Anti-Herpetic Passed - 08/19/2022  9:57 PM      Passed - Valid encounter within last 12 months    Recent Outpatient Visits           1 month ago Post-viral cough syndrome   [place] [name], PA-C   5 months ago Encounter for screening mammogram for malignant neoplasm of breast   [place] Eulas Post, MD   8 months ago Tick bite of left back wall of thorax, subsequent encounter   [facility] Tally Joe T, FNP   1 year ago [place] Eulas Post, MD   1 year ago Annual physical exam   [place] Eulas Post, MD               venlafaxine XR (EFFEXOR-XR) 75 MG 24 hr capsule [Pharmacy Med Name: Venlafaxine HCl ER 75 MG Oral Capsule Extended Release 24 Hour] 90 capsule 0    Sig: TAKE 1 CAPSULE BY MOUTH ONCE DAILY WITH BREAKFAST     Psychiatry: Antidepressants - SNRI - desvenlafaxine & venlafaxine Failed - 08/19/2022  9:57 PM      Failed - Completed PHQ-2 or PHQ-9 in the last 360 days      Failed - Lipid Panel in normal range within the last 12 months    Cholesterol, Total  Date Value Ref Range Status  03/22/2022 178 100 - 199 mg/dL Final   LDL Cholesterol (Calc)  Date Value Ref Range Status  03/31/2017 63 mg/dL (calc) Final    Comment:    Reference range: <100 . Desirable range <100 mg/dL for primary prevention;   <70 mg/dL for patients with CHD or diabetic patients  with > or = 2 CHD risk factors. Mar[place] Kitchen LDL-C is now calculated using the Martin-Hopkins  calculation, which is a validated novel method providing  better accuracy than the  Friedewald equation in the  estimation of LDL-C.  Cresenciano Genre et al. Annamaria Helling. [id number]): 2061-2068  (http://education.[facility].com/faq/FAQ164)    LDL Chol Calc (NIH)  Date Value Ref Range Status  03/22/2022 98 0 - 99 mg/dL Final   HDL  Date Value Ref Range Status  03/22/2022 60 >39 mg/dL Final   Triglycerides  Date Value Ref Range Status  03/22/2022 114 0 - 149 mg/dL Final         Passed - Cr in normal range and within 360 days    Creatinine, Ser  Date Value Ref Range Status  03/22/2022 0.73 0.57 - 1.00 mg/dL Final         Passed - Last BP in normal range    BP Readings from Last 1 Encounters:  07/20/22 131/70         Passed - Valid encounter within last 6 months    Recent Outpatient Visits           1 month ago Post-viral cough syndrome   [place] [name], PA-C   5 months ago Encounter for screening mammogram for malignant neoplasm of breast   [place] Eulas Post, MD   8 months ago Tick bite  of left back wall of thorax, subsequent encounter   [facility] Gwyneth Sprout, FNP   1 year ago [place] Eulas Post, MD   1 year ago Annual physical exam   [facility] Eulas Post, MD

## 2022-08-25 ENCOUNTER — Telehealth: Payer: Self-pay | Admitting: Family Medicine

## 2022-08-25 DIAGNOSIS — E7800 Pure hypercholesterolemia, unspecified: Secondary | ICD-10-CM

## 2022-08-25 MED ORDER — ROSUVASTATIN CALCIUM 5 MG PO TABS: 5.0000 mg | ORAL_TABLET | Freq: Every day | ORAL | 0 refills | Status: DC

## 2022-08-25 NOTE — Telephone Encounter (Signed)
[place] requesting refill rosuvastatin (CRESTOR) 5 MG tablet  Please advise

## 2022-09-05 ENCOUNTER — Telehealth: Payer: Self-pay | Admitting: Family Medicine

## 2022-09-05 NOTE — Telephone Encounter (Signed)
[place] faxed refill request for the following medications:   SYNTHROID 50 MCG tablet    Please advise

## 2022-09-06 MED ORDER — SYNTHROID 50 MCG PO TABS: 50.0000 ug | ORAL_TABLET | Freq: Every day | ORAL | 0 refills | Status: DC

## 2022-09-09 ENCOUNTER — Ambulatory Visit: Payer: Medicare PPO

## 2022-09-09 DIAGNOSIS — K5730 Diverticulosis of large intestine without perforation or abscess without bleeding: Secondary | ICD-10-CM | POA: Diagnosis not present

## 2022-09-09 DIAGNOSIS — R194 Change in bowel habit: Secondary | ICD-10-CM | POA: Diagnosis not present

## 2022-09-09 DIAGNOSIS — K2950 Unspecified chronic gastritis without bleeding: Secondary | ICD-10-CM | POA: Diagnosis not present

## 2022-09-09 DIAGNOSIS — R198 Other specified symptoms and signs involving the digestive system and abdomen: Secondary | ICD-10-CM | POA: Diagnosis not present

## 2022-09-09 DIAGNOSIS — R1314 Dysphagia, pharyngoesophageal phase: Secondary | ICD-10-CM | POA: Diagnosis not present

## 2022-09-09 DIAGNOSIS — Z86010 Personal history of colonic polyps: Secondary | ICD-10-CM | POA: Diagnosis not present

## 2022-10-25 DIAGNOSIS — E039 Hypothyroidism, unspecified: Secondary | ICD-10-CM | POA: Diagnosis not present

## 2022-10-25 DIAGNOSIS — K219 Gastro-esophageal reflux disease without esophagitis: Secondary | ICD-10-CM | POA: Diagnosis not present

## 2022-10-25 DIAGNOSIS — F3342 Major depressive disorder, recurrent, in full remission: Secondary | ICD-10-CM | POA: Diagnosis not present

## 2022-10-25 DIAGNOSIS — F419 Anxiety disorder, unspecified: Secondary | ICD-10-CM | POA: Diagnosis not present

## 2022-10-25 DIAGNOSIS — E7800 Pure hypercholesterolemia, unspecified: Secondary | ICD-10-CM | POA: Diagnosis not present

## 2022-11-14 ENCOUNTER — Other Ambulatory Visit: Payer: Self-pay | Admitting: Family Medicine

## 2022-11-14 DIAGNOSIS — R232 Flushing: Secondary | ICD-10-CM

## 2022-11-14 DIAGNOSIS — F419 Anxiety disorder, unspecified: Secondary | ICD-10-CM

## 2022-11-14 DIAGNOSIS — Z780 Asymptomatic menopausal state: Secondary | ICD-10-CM

## 2022-11-14 DIAGNOSIS — E7800 Pure hypercholesterolemia, unspecified: Secondary | ICD-10-CM

## 2023-01-24 ENCOUNTER — Telehealth: Payer: Self-pay | Admitting: Family Medicine

## 2023-01-24 NOTE — Telephone Encounter (Signed)
 Patient is seeing Dr. Sullivan Lone at [facility]

## 2023-05-30 DIAGNOSIS — Z1231 Encounter for screening mammogram for malignant neoplasm of breast: Secondary | ICD-10-CM | POA: Diagnosis not present

## 2023-05-30 DIAGNOSIS — Z0000 Encounter for general adult medical examination without abnormal findings: Secondary | ICD-10-CM | POA: Diagnosis not present

## 2023-05-30 DIAGNOSIS — F419 Anxiety disorder, unspecified: Secondary | ICD-10-CM | POA: Diagnosis not present

## 2023-05-30 DIAGNOSIS — Z6835 Body mass index (BMI) 35.0-35.9, adult: Secondary | ICD-10-CM | POA: Diagnosis not present

## 2023-05-30 DIAGNOSIS — K219 Gastro-esophageal reflux disease without esophagitis: Secondary | ICD-10-CM | POA: Diagnosis not present

## 2023-05-30 DIAGNOSIS — E7800 Pure hypercholesterolemia, unspecified: Secondary | ICD-10-CM | POA: Diagnosis not present

## 2023-05-30 DIAGNOSIS — E039 Hypothyroidism, unspecified: Secondary | ICD-10-CM | POA: Diagnosis not present

## 2023-06-01 ENCOUNTER — Other Ambulatory Visit: Payer: Self-pay | Admitting: Family Medicine

## 2023-06-01 DIAGNOSIS — Z1231 Encounter for screening mammogram for malignant neoplasm of breast: Secondary | ICD-10-CM

## 2023-07-19 ENCOUNTER — Ambulatory Visit
Admission: RE | Admit: 2023-07-19 | Discharge: 2023-07-19 | Disposition: A | Discharge status: Home / Self Care | Payer: Medicare PPO | Source: Ambulatory Visit | Attending: Family Medicine | Admitting: Family Medicine

## 2023-07-19 DIAGNOSIS — Z1231 Encounter for screening mammogram for malignant neoplasm of breast: Secondary | ICD-10-CM | POA: Insufficient documentation

## 2024-06-21 ENCOUNTER — Encounter: Payer: Self-pay | Admitting: Family Medicine

## 2024-06-21 ENCOUNTER — Other Ambulatory Visit: Payer: Self-pay | Admitting: Family Medicine

## 2024-06-21 DIAGNOSIS — Z1231 Encounter for screening mammogram for malignant neoplasm of breast: Secondary | ICD-10-CM

## 2024-06-21 DIAGNOSIS — Z780 Asymptomatic menopausal state: Secondary | ICD-10-CM

## 2024-08-01 ENCOUNTER — Ambulatory Visit
Admission: RE | Admit: 2024-08-01 | Discharge: 2024-08-01 | Disposition: A | Discharge status: Home / Self Care | Source: Ambulatory Visit | Attending: Family Medicine | Admitting: Family Medicine

## 2024-08-01 DIAGNOSIS — Z1231 Encounter for screening mammogram for malignant neoplasm of breast: Secondary | ICD-10-CM | POA: Insufficient documentation
